# Patient Record
Sex: Female | Born: 1950
Health system: Southern US, Community
[De-identification: ages and names within clinical notes are randomized; demographics above are authoritative.]

## PROBLEM LIST (undated history)

## (undated) DIAGNOSIS — K5792 Diverticulitis of intestine, part unspecified, without perforation or abscess without bleeding: Secondary | ICD-10-CM

## (undated) DIAGNOSIS — M8580 Other specified disorders of bone density and structure, unspecified site: Secondary | ICD-10-CM

## (undated) DIAGNOSIS — IMO0002 Reserved for concepts with insufficient information to code with codable children: Secondary | ICD-10-CM

## (undated) DIAGNOSIS — A609 Anogenital herpesviral infection, unspecified: Secondary | ICD-10-CM

## (undated) DIAGNOSIS — M810 Age-related osteoporosis without current pathological fracture: Secondary | ICD-10-CM

## (undated) DIAGNOSIS — K589 Irritable bowel syndrome without diarrhea: Secondary | ICD-10-CM

## (undated) DIAGNOSIS — K5900 Constipation, unspecified: Secondary | ICD-10-CM

## (undated) HISTORY — DX: Other specified disorders of bone density and structure, unspecified site: M85.80

## (undated) HISTORY — DX: Irritable bowel syndrome, unspecified: K58.9

## (undated) HISTORY — DX: Anogenital herpesviral infection, unspecified: A60.9

## (undated) HISTORY — DX: Diverticulitis of intestine, part unspecified, without perforation or abscess without bleeding: K57.92

## (undated) HISTORY — PX: OTHER SURGICAL HISTORY: SHX169

## (undated) HISTORY — DX: Reserved for concepts with insufficient information to code with codable children: IMO0002

## (undated) HISTORY — DX: Constipation, unspecified: K59.00

## (undated) HISTORY — PX: APPENDECTOMY: SHX54

## (undated) SURGERY — Surgical Case
Anesthesia: *Unknown

---
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
## 1997-12-25 ENCOUNTER — Ambulatory Visit (HOSPITAL_COMMUNITY): Admission: RE | Admit: 1997-12-25 | Discharge: 1997-12-25 | Payer: Self-pay | Admitting: Internal Medicine

## 1998-02-05 ENCOUNTER — Other Ambulatory Visit: Admission: RE | Admit: 1998-02-05 | Discharge: 1998-02-05 | Payer: Self-pay | Admitting: Gynecology

## 1998-05-05 ENCOUNTER — Other Ambulatory Visit: Admission: RE | Admit: 1998-05-05 | Discharge: 1998-05-05 | Payer: Self-pay | Admitting: Gynecology

## 1998-06-05 DIAGNOSIS — IMO0002 Reserved for concepts with insufficient information to code with codable children: Secondary | ICD-10-CM

## 1998-06-05 HISTORY — DX: Reserved for concepts with insufficient information to code with codable children: IMO0002

## 1998-11-15 ENCOUNTER — Other Ambulatory Visit: Admission: RE | Admit: 1998-11-15 | Discharge: 1998-11-15 | Payer: Self-pay | Admitting: Gynecology

## 1998-12-31 ENCOUNTER — Other Ambulatory Visit: Admission: RE | Admit: 1998-12-31 | Discharge: 1998-12-31 | Payer: Self-pay | Admitting: Gynecology

## 1999-08-09 ENCOUNTER — Other Ambulatory Visit: Admission: RE | Admit: 1999-08-09 | Discharge: 1999-08-09 | Payer: Self-pay | Admitting: Gynecology

## 2000-02-08 ENCOUNTER — Other Ambulatory Visit: Admission: RE | Admit: 2000-02-08 | Discharge: 2000-02-08 | Payer: Self-pay | Admitting: Gynecology

## 2000-12-19 ENCOUNTER — Other Ambulatory Visit: Admission: RE | Admit: 2000-12-19 | Discharge: 2000-12-19 | Payer: Self-pay | Admitting: Gynecology

## 2001-02-01 ENCOUNTER — Emergency Department (HOSPITAL_COMMUNITY): Admission: EM | Admit: 2001-02-01 | Discharge: 2001-02-01 | Payer: Self-pay | Admitting: Emergency Medicine

## 2001-12-25 ENCOUNTER — Other Ambulatory Visit: Admission: RE | Admit: 2001-12-25 | Discharge: 2001-12-25 | Payer: Self-pay | Admitting: Gynecology

## 2003-02-26 ENCOUNTER — Other Ambulatory Visit: Admission: RE | Admit: 2003-02-26 | Discharge: 2003-02-26 | Payer: Self-pay | Admitting: Gynecology

## 2010-06-05 HISTORY — PX: COLON SURGERY: SHX602

## 2014-07-16 ENCOUNTER — Ambulatory Visit: Payer: Self-pay | Admitting: Gynecology

## 2014-07-17 ENCOUNTER — Encounter: Payer: Self-pay | Admitting: Gynecology

## 2014-07-17 ENCOUNTER — Other Ambulatory Visit (HOSPITAL_COMMUNITY)
Admission: RE | Admit: 2014-07-17 | Discharge: 2014-07-17 | Disposition: A | Discharge status: Home / Self Care | Payer: BLUE CROSS/BLUE SHIELD | Source: Ambulatory Visit | Attending: Gynecology | Admitting: Gynecology

## 2014-07-17 ENCOUNTER — Ambulatory Visit (INDEPENDENT_AMBULATORY_CARE_PROVIDER_SITE_OTHER): Payer: BLUE CROSS/BLUE SHIELD | Admitting: Gynecology

## 2014-07-17 VITALS — BP 118/80 | Ht 64.0 in | Wt 169.0 lb

## 2014-07-17 DIAGNOSIS — N952 Postmenopausal atrophic vaginitis: Secondary | ICD-10-CM

## 2014-07-17 DIAGNOSIS — M8580 Other specified disorders of bone density and structure, unspecified site: Secondary | ICD-10-CM

## 2014-07-17 DIAGNOSIS — Z01419 Encounter for gynecological examination (general) (routine) without abnormal findings: Secondary | ICD-10-CM | POA: Diagnosis not present

## 2014-07-17 DIAGNOSIS — Z1151 Encounter for screening for human papillomavirus (HPV): Secondary | ICD-10-CM | POA: Insufficient documentation

## 2014-07-17 LAB — URINALYSIS W MICROSCOPIC + REFLEX CULTURE
Bacteria, UA: NONE SEEN
Bilirubin Urine: NEGATIVE
Casts: NONE SEEN
Glucose, UA: NEGATIVE mg/dL
Hgb urine dipstick: NEGATIVE
Ketones, ur: NEGATIVE mg/dL
Nitrite: NEGATIVE
Protein, ur: NEGATIVE mg/dL
Specific Gravity, Urine: 1.025 (ref 1.005–1.030)
Urobilinogen, UA: 1 mg/dL (ref 0.0–1.0)
pH: 6 (ref 5.0–8.0)

## 2014-07-17 MED ORDER — VALACYCLOVIR HCL 500 MG PO TABS: 500.0000 mg | ORAL_TABLET | Freq: Two times a day (BID) | ORAL | Status: DC

## 2014-07-17 NOTE — Progress Notes (Signed)
Tracy [name] 01-09-1951 [id number]        64 y.o.  [id number] for annual exam.  Has recently moved back into the area and has not been seen in this office for a number of years. Several issues noted below.  Past medical history,surgical history, problem list, medications, allergies, family history and social history were all reviewed and documented as reviewed in the EPIC chart.  ROS:  Performed with pertinent positives and negatives included in the history, assessment and plan.   Additional significant findings :  none   Exam: Tracy [name] Vitals:   07/17/14 0829  BP: 118/80  Height: 5\' 4"  (1.626 m)  Weight: 169 lb (76.658 kg)   General appearance:  Normal affect, orientation and appearance. Skin: Grossly normal HEENT: Without gross lesions.  No cervical or supraclavicular adenopathy. Thyroid normal.  Lungs:  Clear without wheezing, rales or rhonchi Cardiac: RR, without RMG Abdominal:  Soft, nontender, without masses, guarding, rebound, organomegaly or hernia Breasts:  Examined lying and sitting without masses, retractions, discharge or axillary adenopathy. Pelvic:  Ext/BUS/vagina with mild atrophic changes. Mild pelvic relaxation noted but no significant cystocele or rectocele or uterine prolapse.  Cervix with atrophic changes. Pap smear/HPV  Uterus axial to retroverted, normal size, shape and contour, midline and mobile nontender   Adnexa  Without masses or tenderness    Anus and perineum  Normal   Rectovaginal  Normal sphincter tone without palpated masses or tenderness.    Assessment/Plan:  64 y.o. [id number] female for annual exam.   1. Postmenopausal/atrophic genital changes. Patient does note some intermittent vaginal dryness. No significant hot flushes or night sweats or any vaginal bleeding. Has not tried any OTC products. Recommended trial of OTC moisturizers such as Replens. Reviewed options to include vaginal estrogen support. Patient not interested at this  time. Will represent if continues to be an issue and wants to rediscuss options. 2. Pelvic pressure. Patient notes some pelvic pressure and wonders if things "are falling out". Exam shows some relaxation but no significant cystocele rectocele or uterine prolapse. Reviewed with patient and she is comfortable with following at this point. Check urinalysis. Will follow up if symptoms worsen and she wants to rediscuss options. 3. Osteopenia. DEXA 2004 with osteopenia T score -2.3. Transient trial of Fosamax which was discontinued due to GI upset. Has not done anything further for treatment. Repeat ports last bone density 2014 in [place]. Will obtain copy for me. Schedule follow up DEXA now a 2 year interval. Increased calcium and vitamin D reviewed. 4. History of genital HSV. Occasional outbreaks. Uses Valtrex 500 mg twice a day 5 days. Prescription provided with 5 refills. 5. Mammography 2014. Patient knows she is overdue and agrees to schedule. Names and numbers provided. SBE monthly reviewed. 6. Pap smear reported 2014. History of transient ascus 2000 with negative colposcopy and ECC. Subsequent Pap smears have all been normal. Pap smear/HPV done today as baseline. 7. Colonoscopy reported 2008 with recommended repeat interval 10 years. 8. Health maintenance. Patient reports recent exam with Dr. Laurann Montana to include lab work. No blood work done today. Follow up for bone density otherwise annually, sooner if any issues.     Anastasio Auerbach MD, 9:03 AM 07/17/2014

## 2014-07-17 NOTE — Addendum Note (Signed)
Addended by: Burnett Kanaris on: 07/17/2014 09:31 AM   Modules accepted: Orders, SmartSet

## 2014-07-17 NOTE — Patient Instructions (Signed)
Call to Schedule your mammogram  Facilities in [place]: 1)  [facility], [address] Phone: [phone] 2)  [facility]. [address] [place] Phone: [phone] 3)  Dr. Isaiah Blakes at [facility] [address] Phone: [phone]     Mammogram A mammogram is an X-ray test to find changes in a woman's breast. You should get a mammogram if:  You are 65 years of age or older  You have risk factors.   Your doctor recommends that you have one.  BEFORE THE TEST  Do not schedule the test the week before your period, especially if your breasts are sore during this time.  On the day of your mammogram:  Wash your breasts and armpits well. After washing, do not put on any deodorant or talcum powder on until after your test.   Eat and drink as you usually do.   Take your medicines as usual.   If you are diabetic and take insulin, make sure you:   Eat before coming for your test.   Take your insulin as usual.   If you cannot keep your appointment, call before the appointment to cancel. Schedule another appointment.  TEST  You will need to undress from the waist up. You will put on a hospital gown.   Your breast will be put on the mammogram machine, and it will press firmly on your breast with a piece of plastic called a compression paddle. This will make your breast flatter so that the machine can X-ray all parts of your breast.   Both breasts will be X-rayed. Each breast will be X-rayed from above and from the side. An X-ray might need to be taken again if the picture is not good enough.   The mammogram will last about 15 to 30 minutes.  AFTER THE TEST Finding out the results of your test Ask when your test results will be ready. Make sure you get your test results.  Document Released: 08/18/2008 Document Revised: 05/11/2011 Document Reviewed: 08/18/2008 [facility] Patient  Information 2012 [place].  You may obtain a copy of any labs that were done today by logging onto MyChart as outlined in the instructions provided with your AVS (after visit summary). The office will not call with normal lab results but certainly if there are any significant abnormalities then we will contact you.   Health Maintenance, Female A healthy lifestyle and preventative care can promote health and wellness.  Maintain regular health, dental, and eye exams.  Eat a healthy diet. Foods like vegetables, fruits, whole grains, low-fat dairy products, and lean protein foods contain the nutrients you need without too many calories. Decrease your intake of foods high in solid fats, added sugars, and salt. Get information about a proper diet from your caregiver, if necessary.  Regular physical exercise is one of the most important things you can do for your health. Most adults should get at least 150 minutes of moderate-intensity exercise (any activity that increases your heart rate and causes you to sweat) each week. In addition, most adults need muscle-strengthening exercises on 2 or more days a week.   Maintain a healthy weight. The body mass index (BMI) is a screening tool to identify possible weight problems. It provides an estimate of body fat based on height and weight. Your caregiver can help determine your BMI, and can help you achieve or maintain a healthy weight. For adults  20 years and older:  A BMI below 18.5 is considered underweight.  A BMI of 18.5 to 24.9 is normal.  A BMI of 25 to 29.9 is considered overweight.  A BMI of 30 and above is considered obese.  Maintain normal blood lipids and cholesterol by exercising and minimizing your intake of saturated fat. Eat a balanced diet with plenty of fruits and vegetables. Blood tests for lipids and cholesterol should begin at age 20 and be repeated every 5 years. If your lipid or cholesterol levels are high, you are over 50, or  you are a high risk for heart disease, you may need your cholesterol levels checked more frequently.Ongoing high lipid and cholesterol levels should be treated with medicines if diet and exercise are not effective.  If you smoke, find out from your caregiver how to quit. If you do not use tobacco, do not start.  Lung cancer screening is recommended for adults aged 55 80 years who are at high risk for developing lung cancer because of a history of smoking. Yearly low-dose computed tomography (CT) is recommended for people who have at least a 30-pack-year history of smoking and are a current smoker or have quit within the past 15 years. A pack year of smoking is smoking an average of 1 pack of cigarettes a day for 1 year (for example: 1 pack a day for 30 years or 2 packs a day for 15 years). Yearly screening should continue until the smoker has stopped smoking for at least 15 years. Yearly screening should also be stopped for people who develop a health problem that would prevent them from having lung cancer treatment.  If you are pregnant, do not drink alcohol. If you are breastfeeding, be very cautious about drinking alcohol. If you are not pregnant and choose to drink alcohol, do not exceed 1 drink per day. One drink is considered to be 12 ounces (355 mL) of beer, 5 ounces (148 mL) of wine, or 1.5 ounces (44 mL) of liquor.  Avoid use of street drugs. Do not share needles with anyone. Ask for help if you need support or instructions about stopping the use of drugs.  High blood pressure causes heart disease and increases the risk of stroke. Blood pressure should be checked at least every 1 to 2 years. Ongoing high blood pressure should be treated with medicines, if weight loss and exercise are not effective.  If you are 55 to 65 years old, ask your caregiver if you should take aspirin to prevent strokes.  Diabetes screening involves taking a blood sample to check your fasting blood sugar level. This  should be done once every 3 years, after age 45, if you are within normal weight and without risk factors for diabetes. Testing should be considered at a younger age or be carried out more frequently if you are overweight and have at least 1 risk factor for diabetes.  Breast cancer screening is essential preventative care for women. You should practice "breast self-awareness." This means understanding the normal appearance and feel of your breasts and may include breast self-examination. Any changes detected, no matter how small, should be reported to a caregiver. Women in their 20s and 30s should have a clinical breast exam (CBE) by a caregiver as part of a regular health exam every 1 to 3 years. After age 40, women should have a CBE every year. Starting at age 40, women should consider having a mammogram (breast X-ray) every year. Women who have a   family history of breast cancer should talk to their caregiver about genetic screening. Women at a high risk of breast cancer should talk to their caregiver about having an MRI and a mammogram every year.  Breast cancer gene (BRCA)-related cancer risk assessment is recommended for women who have family members with BRCA-related cancers. BRCA-related cancers include breast, ovarian, tubal, and peritoneal cancers. Having family members with these cancers may be associated with an increased risk for harmful changes (mutations) in the breast cancer genes BRCA1 and BRCA2. Results of the assessment will determine the need for genetic counseling and BRCA1 and BRCA2 testing.  The Pap test is a screening test for cervical cancer. Women should have a Pap test starting at age 33. Between ages 75 and 14, Pap tests should be repeated every 2 years. Beginning at age 4, you should have a Pap test every 3 years as long as the past 3 Pap tests have been normal. If you had a hysterectomy for a problem that was not cancer or a condition that could lead to cancer, then you no longer  need Pap tests. If you are between ages 72 and 12, and you have had normal Pap tests going back 10 years, you no longer need Pap tests. If you have had past treatment for cervical cancer or a condition that could lead to cancer, you need Pap tests and screening for cancer for at least 20 years after your treatment. If Pap tests have been discontinued, risk factors (such as a new sexual partner) need to be reassessed to determine if screening should be resumed. Some women have medical problems that increase the chance of getting cervical cancer. In these cases, your caregiver may recommend more frequent screening and Pap tests.  The human papillomavirus (HPV) test is an additional test that may be used for cervical cancer screening. The HPV test looks for the virus that can cause the cell changes on the cervix. The cells collected during the Pap test can be tested for HPV. The HPV test could be used to screen women aged 84 years and older, and should be used in women of any age who have unclear Pap test results. After the age of 73, women should have HPV testing at the same frequency as a Pap test.  Colorectal cancer can be detected and often prevented. Most routine colorectal cancer screening begins at the age of 56 and continues through age 76. However, your caregiver may recommend screening at an earlier age if you have risk factors for colon cancer. On a yearly basis, your caregiver may provide home test kits to check for hidden blood in the stool. Use of a small camera at the end of a tube, to directly examine the colon (sigmoidoscopy or colonoscopy), can detect the earliest forms of colorectal cancer. Talk to your caregiver about this at age 20, when routine screening begins. Direct examination of the colon should be repeated every 5 to 10 years through age 83, unless early forms of pre-cancerous polyps or small growths are found.  Hepatitis C blood testing is recommended for all people born from [birth year]  through 1965 and any individual with known risks for hepatitis C.  Practice safe sex. Use condoms and avoid high-risk sexual practices to reduce the spread of sexually transmitted infections (STIs). Sexually active women aged 28 and younger should be checked for Chlamydia, which is a common sexually transmitted infection. Older women with new or multiple partners should also be tested for Chlamydia. Testing for  other STIs is recommended if you are sexually active and at increased risk.  Osteoporosis is a disease in which the bones lose minerals and strength with aging. This can result in serious bone fractures. The risk of osteoporosis can be identified using a bone density scan. Women ages 35 and over and women at risk for fractures or osteoporosis should discuss screening with their caregivers. Ask your caregiver whether you should be taking a calcium supplement or vitamin D to reduce the rate of osteoporosis.  Menopause can be associated with physical symptoms and risks. Hormone replacement therapy is available to decrease symptoms and risks. You should talk to your caregiver about whether hormone replacement therapy is right for you.  Use sunscreen. Apply sunscreen liberally and repeatedly throughout the day. You should seek shade when your shadow is shorter than you. Protect yourself by wearing long sleeves, pants, a wide-brimmed hat, and sunglasses year round, whenever you are outdoors.  Notify your caregiver of new moles or changes in moles, especially if there is a change in shape or color. Also notify your caregiver if a mole is larger than the size of a pencil eraser.  Stay current with your immunizations. Document Released: 12/05/2010 Document Revised: 09/16/2012 Document Reviewed: 12/05/2010 [facility] Patient Information 2014 [place].

## 2014-07-19 LAB — URINE CULTURE
Colony Count: NO GROWTH
Organism ID, Bacteria: NO GROWTH

## 2014-07-20 LAB — CYTOLOGY - PAP

## 2014-07-24 ENCOUNTER — Encounter: Payer: Self-pay | Admitting: Gynecology

## 2014-07-27 ENCOUNTER — Ambulatory Visit (INDEPENDENT_AMBULATORY_CARE_PROVIDER_SITE_OTHER): Payer: BLUE CROSS/BLUE SHIELD

## 2014-07-27 ENCOUNTER — Other Ambulatory Visit: Payer: Self-pay | Admitting: Gynecology

## 2014-07-27 DIAGNOSIS — M8580 Other specified disorders of bone density and structure, unspecified site: Secondary | ICD-10-CM

## 2014-07-27 DIAGNOSIS — Z13820 Encounter for screening for osteoporosis: Secondary | ICD-10-CM

## 2014-07-27 DIAGNOSIS — Z780 Asymptomatic menopausal state: Secondary | ICD-10-CM | POA: Diagnosis not present

## 2014-07-28 ENCOUNTER — Encounter: Payer: Self-pay | Admitting: Gynecology

## 2014-08-06 ENCOUNTER — Other Ambulatory Visit: Payer: Self-pay | Admitting: Gynecology

## 2014-08-06 DIAGNOSIS — Z780 Asymptomatic menopausal state: Secondary | ICD-10-CM

## 2014-08-06 DIAGNOSIS — Z13820 Encounter for screening for osteoporosis: Secondary | ICD-10-CM

## 2014-08-06 DIAGNOSIS — M8580 Other specified disorders of bone density and structure, unspecified site: Secondary | ICD-10-CM

## 2014-08-13 ENCOUNTER — Other Ambulatory Visit: Payer: Self-pay | Admitting: Gynecology

## 2014-08-13 DIAGNOSIS — M8580 Other specified disorders of bone density and structure, unspecified site: Secondary | ICD-10-CM

## 2014-08-20 ENCOUNTER — Encounter: Payer: Self-pay | Admitting: Gynecology

## 2015-03-15 DIAGNOSIS — H04123 Dry eye syndrome of bilateral lacrimal glands: Secondary | ICD-10-CM | POA: Diagnosis not present

## 2015-03-15 DIAGNOSIS — H2513 Age-related nuclear cataract, bilateral: Secondary | ICD-10-CM | POA: Diagnosis not present

## 2015-05-04 DIAGNOSIS — L814 Other melanin hyperpigmentation: Secondary | ICD-10-CM | POA: Diagnosis not present

## 2015-05-04 DIAGNOSIS — D2271 Melanocytic nevi of right lower limb, including hip: Secondary | ICD-10-CM | POA: Diagnosis not present

## 2015-05-04 DIAGNOSIS — I788 Other diseases of capillaries: Secondary | ICD-10-CM | POA: Diagnosis not present

## 2015-05-04 DIAGNOSIS — L738 Other specified follicular disorders: Secondary | ICD-10-CM | POA: Diagnosis not present

## 2015-05-04 DIAGNOSIS — D2261 Melanocytic nevi of right upper limb, including shoulder: Secondary | ICD-10-CM | POA: Diagnosis not present

## 2015-05-04 DIAGNOSIS — L821 Other seborrheic keratosis: Secondary | ICD-10-CM | POA: Diagnosis not present

## 2015-05-04 DIAGNOSIS — L819 Disorder of pigmentation, unspecified: Secondary | ICD-10-CM | POA: Diagnosis not present

## 2015-05-04 DIAGNOSIS — L820 Inflamed seborrheic keratosis: Secondary | ICD-10-CM | POA: Diagnosis not present

## 2015-05-04 DIAGNOSIS — D1801 Hemangioma of skin and subcutaneous tissue: Secondary | ICD-10-CM | POA: Diagnosis not present

## 2015-05-04 DIAGNOSIS — D2272 Melanocytic nevi of left lower limb, including hip: Secondary | ICD-10-CM | POA: Diagnosis not present

## 2015-06-18 ENCOUNTER — Other Ambulatory Visit: Payer: Self-pay | Admitting: Internal Medicine

## 2015-06-18 ENCOUNTER — Ambulatory Visit
Admission: RE | Admit: 2015-06-18 | Discharge: 2015-06-18 | Disposition: A | Discharge status: Home / Self Care | Payer: BLUE CROSS/BLUE SHIELD | Source: Ambulatory Visit | Attending: Internal Medicine | Admitting: Internal Medicine

## 2015-06-18 DIAGNOSIS — Z0000 Encounter for general adult medical examination without abnormal findings: Secondary | ICD-10-CM | POA: Diagnosis not present

## 2015-06-18 DIAGNOSIS — Z23 Encounter for immunization: Secondary | ICD-10-CM | POA: Diagnosis not present

## 2015-06-18 DIAGNOSIS — M2550 Pain in unspecified joint: Secondary | ICD-10-CM | POA: Diagnosis not present

## 2015-06-18 DIAGNOSIS — M25562 Pain in left knee: Secondary | ICD-10-CM | POA: Diagnosis not present

## 2015-08-02 ENCOUNTER — Ambulatory Visit (INDEPENDENT_AMBULATORY_CARE_PROVIDER_SITE_OTHER): Payer: BLUE CROSS/BLUE SHIELD | Admitting: Gynecology

## 2015-08-02 ENCOUNTER — Encounter: Payer: Self-pay | Admitting: Gynecology

## 2015-08-02 VITALS — BP 130/80 | Ht 63.0 in | Wt 171.0 lb

## 2015-08-02 DIAGNOSIS — Z01419 Encounter for gynecological examination (general) (routine) without abnormal findings: Secondary | ICD-10-CM | POA: Diagnosis not present

## 2015-08-02 DIAGNOSIS — M8580 Other specified disorders of bone density and structure, unspecified site: Secondary | ICD-10-CM | POA: Diagnosis not present

## 2015-08-02 DIAGNOSIS — A609 Anogenital herpesviral infection, unspecified: Secondary | ICD-10-CM

## 2015-08-02 DIAGNOSIS — N952 Postmenopausal atrophic vaginitis: Secondary | ICD-10-CM | POA: Diagnosis not present

## 2015-08-02 MED ORDER — VALACYCLOVIR HCL 500 MG PO TABS: 500.0000 mg | ORAL_TABLET | Freq: Two times a day (BID) | ORAL | Status: DC

## 2015-08-02 NOTE — Patient Instructions (Signed)

## 2015-08-02 NOTE — Progress Notes (Signed)
Tracy [name] July 09, 1950 [id number]        64 y.o.  [id number]  for breast and pelvic exam.  Several issues noted below  Past medical history,surgical history, problem list, medications, allergies, family history and social history were all reviewed and documented as reviewed in the EPIC chart.  ROS:  Performed with pertinent positives and negatives included in the history, assessment and plan.   Additional significant findings :  none   Exam: Tracy [name] assistant Filed Vitals:   08/02/15 1055  BP: 130/80  Height: 5\' 3"  (1.6 m)  Weight: 171 lb (77.565 kg)   General appearance:  Normal affect, orientation and appearance. Skin: Grossly normal HEENT: Without gross lesions.  No cervical or supraclavicular adenopathy. Thyroid normal.  Lungs:  Clear without wheezing, rales or rhonchi Cardiac: RR, without RMG Abdominal:  Soft, nontender, without masses, guarding, rebound, organomegaly or hernia Breasts:  Examined lying and sitting without masses, retractions, discharge or axillary adenopathy. Pelvic:  Ext/BUS/vagina with atrophic changes. Mild relaxation noted with mild cystocele and rectocele  Cervix atrophic  Uterus anteverted, normal size, shape and contour, midline and mobile nontender   Adnexa without masses or tenderness    Anus and perineum normal   Rectovaginal normal sphincter tone without palpated masses or tenderness.    Assessment/Plan:  65 y.o. [id number] female for breast and pelvic exam.   1. Postmenopausal/atrophic genital changes. Without significant hot flushes, night sweats, vaginal dryness or any vaginal bleeding. Continue to monitor and report any issues or vaginal bleeding. 2. Mild pelvic relaxation. Asymptomatic to the patient. Obtain a follow with annual exams. 3. Osteopenia.  DEXA 07/2014 T score -2.2 FRAX 7.9%/0.9%. Repeat DEXA next year at two-year interval. Increased calcium and vitamin D. 4. Pap smear/HPV 07/2014 negative. No history of abnormal Pap smears.  Options to stop screening altogether versus less for screening intervals as she is now 14 discussed. Will readdress on annual basis. 5. Mammography coming due in March/April. I reminded her to schedule this. SBE monthly reviewed. 6. Colonoscopy 2008. Patient reports 10 year recommendation. 7. History of HSV. Uses Valtrex intermittently. Refill provided. 8. Health maintenance. No routine blood work done as patient reports this done at her primary physician's office. Follow up in one year, sooner as needed.   Anastasio Auerbach MD, 11:17 AM 08/02/2015

## 2015-08-03 LAB — URINALYSIS W MICROSCOPIC + REFLEX CULTURE
Bacteria, UA: NONE SEEN [HPF]
Bilirubin Urine: NEGATIVE
Casts: NONE SEEN [LPF]
Crystals: NONE SEEN [HPF]
Glucose, UA: NEGATIVE
Hgb urine dipstick: NEGATIVE
Ketones, ur: NEGATIVE
Leukocytes, UA: NEGATIVE
Nitrite: NEGATIVE
Protein, ur: NEGATIVE
Specific Gravity, Urine: 1.019 (ref 1.001–1.035)
Yeast: NONE SEEN [HPF]
pH: 7 (ref 5.0–8.0)

## 2015-08-05 ENCOUNTER — Other Ambulatory Visit: Payer: Self-pay | Admitting: Gynecology

## 2015-08-05 MED ORDER — SULFAMETHOXAZOLE-TRIMETHOPRIM 800-160 MG PO TABS: 1.0000 | ORAL_TABLET | Freq: Two times a day (BID) | ORAL | Status: DC

## 2015-08-07 LAB — URINE CULTURE: Colony Count: >=100000

## 2015-08-12 DIAGNOSIS — E669 Obesity, unspecified: Secondary | ICD-10-CM | POA: Diagnosis not present

## 2015-08-12 DIAGNOSIS — R1032 Left lower quadrant pain: Secondary | ICD-10-CM | POA: Diagnosis not present

## 2015-08-12 DIAGNOSIS — K5900 Constipation, unspecified: Secondary | ICD-10-CM | POA: Diagnosis not present

## 2015-08-12 DIAGNOSIS — K5730 Diverticulosis of large intestine without perforation or abscess without bleeding: Secondary | ICD-10-CM | POA: Diagnosis not present

## 2015-09-13 DIAGNOSIS — Z803 Family history of malignant neoplasm of breast: Secondary | ICD-10-CM | POA: Diagnosis not present

## 2015-09-13 DIAGNOSIS — Z1231 Encounter for screening mammogram for malignant neoplasm of breast: Secondary | ICD-10-CM | POA: Diagnosis not present

## 2015-09-24 ENCOUNTER — Encounter: Payer: Self-pay | Admitting: Gynecology

## 2015-10-11 DIAGNOSIS — M25561 Pain in right knee: Secondary | ICD-10-CM | POA: Diagnosis not present

## 2015-10-11 DIAGNOSIS — M546 Pain in thoracic spine: Secondary | ICD-10-CM | POA: Diagnosis not present

## 2015-10-11 DIAGNOSIS — M791 Myalgia: Secondary | ICD-10-CM | POA: Diagnosis not present

## 2015-10-11 DIAGNOSIS — R5381 Other malaise: Secondary | ICD-10-CM | POA: Diagnosis not present

## 2015-11-08 DIAGNOSIS — M166 Other bilateral secondary osteoarthritis of hip: Secondary | ICD-10-CM | POA: Diagnosis not present

## 2015-11-08 DIAGNOSIS — M546 Pain in thoracic spine: Secondary | ICD-10-CM | POA: Diagnosis not present

## 2015-11-08 DIAGNOSIS — M174 Other bilateral secondary osteoarthritis of knee: Secondary | ICD-10-CM | POA: Diagnosis not present

## 2015-11-08 DIAGNOSIS — M5137 Other intervertebral disc degeneration, lumbosacral region: Secondary | ICD-10-CM | POA: Diagnosis not present

## 2016-02-21 DIAGNOSIS — M5137 Other intervertebral disc degeneration, lumbosacral region: Secondary | ICD-10-CM | POA: Diagnosis not present

## 2016-02-21 DIAGNOSIS — M79641 Pain in right hand: Secondary | ICD-10-CM | POA: Diagnosis not present

## 2016-02-21 DIAGNOSIS — M5030 Other cervical disc degeneration, unspecified cervical region: Secondary | ICD-10-CM | POA: Diagnosis not present

## 2016-02-21 DIAGNOSIS — M546 Pain in thoracic spine: Secondary | ICD-10-CM | POA: Diagnosis not present

## 2016-02-22 ENCOUNTER — Encounter (INDEPENDENT_AMBULATORY_CARE_PROVIDER_SITE_OTHER): Payer: Self-pay

## 2016-06-19 DIAGNOSIS — M47814 Spondylosis without myelopathy or radiculopathy, thoracic region: Secondary | ICD-10-CM | POA: Diagnosis not present

## 2016-06-19 DIAGNOSIS — M5134 Other intervertebral disc degeneration, thoracic region: Secondary | ICD-10-CM | POA: Diagnosis not present

## 2016-06-19 DIAGNOSIS — M546 Pain in thoracic spine: Secondary | ICD-10-CM | POA: Diagnosis not present

## 2016-06-19 DIAGNOSIS — M9902 Segmental and somatic dysfunction of thoracic region: Secondary | ICD-10-CM | POA: Diagnosis not present

## 2016-06-20 DIAGNOSIS — R030 Elevated blood-pressure reading, without diagnosis of hypertension: Secondary | ICD-10-CM | POA: Diagnosis not present

## 2016-06-20 DIAGNOSIS — Z1389 Encounter for screening for other disorder: Secondary | ICD-10-CM | POA: Diagnosis not present

## 2016-06-20 DIAGNOSIS — Z0000 Encounter for general adult medical examination without abnormal findings: Secondary | ICD-10-CM | POA: Diagnosis not present

## 2016-07-17 DIAGNOSIS — M9902 Segmental and somatic dysfunction of thoracic region: Secondary | ICD-10-CM | POA: Diagnosis not present

## 2016-07-17 DIAGNOSIS — M546 Pain in thoracic spine: Secondary | ICD-10-CM | POA: Diagnosis not present

## 2016-07-17 DIAGNOSIS — M47814 Spondylosis without myelopathy or radiculopathy, thoracic region: Secondary | ICD-10-CM | POA: Diagnosis not present

## 2016-07-17 DIAGNOSIS — M5134 Other intervertebral disc degeneration, thoracic region: Secondary | ICD-10-CM | POA: Diagnosis not present

## 2016-07-31 DIAGNOSIS — D2372 Other benign neoplasm of skin of left lower limb, including hip: Secondary | ICD-10-CM | POA: Diagnosis not present

## 2016-07-31 DIAGNOSIS — D2272 Melanocytic nevi of left lower limb, including hip: Secondary | ICD-10-CM | POA: Diagnosis not present

## 2016-07-31 DIAGNOSIS — L738 Other specified follicular disorders: Secondary | ICD-10-CM | POA: Diagnosis not present

## 2016-07-31 DIAGNOSIS — D2271 Melanocytic nevi of right lower limb, including hip: Secondary | ICD-10-CM | POA: Diagnosis not present

## 2016-07-31 DIAGNOSIS — L821 Other seborrheic keratosis: Secondary | ICD-10-CM | POA: Diagnosis not present

## 2016-07-31 DIAGNOSIS — L918 Other hypertrophic disorders of the skin: Secondary | ICD-10-CM | POA: Diagnosis not present

## 2016-07-31 DIAGNOSIS — D2261 Melanocytic nevi of right upper limb, including shoulder: Secondary | ICD-10-CM | POA: Diagnosis not present

## 2016-07-31 DIAGNOSIS — D1801 Hemangioma of skin and subcutaneous tissue: Secondary | ICD-10-CM | POA: Diagnosis not present

## 2016-08-02 ENCOUNTER — Encounter: Payer: BLUE CROSS/BLUE SHIELD | Admitting: Gynecology

## 2016-08-07 DIAGNOSIS — M47814 Spondylosis without myelopathy or radiculopathy, thoracic region: Secondary | ICD-10-CM | POA: Diagnosis not present

## 2016-08-07 DIAGNOSIS — M9902 Segmental and somatic dysfunction of thoracic region: Secondary | ICD-10-CM | POA: Diagnosis not present

## 2016-08-07 DIAGNOSIS — M5134 Other intervertebral disc degeneration, thoracic region: Secondary | ICD-10-CM | POA: Diagnosis not present

## 2016-08-07 DIAGNOSIS — M546 Pain in thoracic spine: Secondary | ICD-10-CM | POA: Diagnosis not present

## 2016-08-09 ENCOUNTER — Encounter: Payer: Self-pay | Admitting: Gynecology

## 2016-08-09 ENCOUNTER — Ambulatory Visit (INDEPENDENT_AMBULATORY_CARE_PROVIDER_SITE_OTHER): Payer: Medicare Other | Admitting: Gynecology

## 2016-08-09 VITALS — BP 124/80 | Ht 63.0 in | Wt 168.0 lb

## 2016-08-09 DIAGNOSIS — M899 Disorder of bone, unspecified: Secondary | ICD-10-CM

## 2016-08-09 DIAGNOSIS — N952 Postmenopausal atrophic vaginitis: Secondary | ICD-10-CM

## 2016-08-09 DIAGNOSIS — Z01411 Encounter for gynecological examination (general) (routine) with abnormal findings: Secondary | ICD-10-CM | POA: Diagnosis not present

## 2016-08-09 NOTE — Patient Instructions (Signed)
Followup for bone density as scheduled. 

## 2016-08-09 NOTE — Progress Notes (Signed)
    Tracy [name] September 07, 1950 [id number]        66 y.o.  [id number] for annual exam.    Past medical history,surgical history, problem list, medications, allergies, family history and social history were all reviewed and documented as reviewed in the EPIC chart.  ROS:  Performed with pertinent positives and negatives included in the history, assessment and plan.   Additional significant findings :  None   Exam: [name] assistant Vitals:   08/09/16 1111  BP: 124/80  Weight: 168 lb (76.2 kg)  Height: 5\' 3"  (1.6 m)   Body mass index is 29.76 kg/m.  General appearance:  Normal affect, orientation and appearance. Skin: Grossly normal HEENT: Without gross lesions.  No cervical or supraclavicular adenopathy. Thyroid normal.  Lungs:  Clear without wheezing, rales or rhonchi Cardiac: RR, without RMG Abdominal:  Soft, nontender, without masses, guarding, rebound, organomegaly or hernia Breasts:  Examined lying and sitting without masses, retractions, discharge or axillary adenopathy. Pelvic:  Ext, BUS, Vagina: With atrophic changes. Minimal cystocele/rectocele  Cervix: With atrophic changes  Uterus: Anteverted, normal size, shape and contour, midline and mobile nontender   Adnexa: Without masses or tenderness    Anus and perineum: Normal   Rectovaginal: Normal sphincter tone without palpated masses or tenderness.    Assessment/Plan:  66 y.o. [id number] female for annual exam.   1. Postmenopausal/atrophic genital changes. No significant hot flushes, night sweats, vaginal dryness or bleeding. Continue monitor report any issues. 2. Mild pelvic relaxation asymptomatic to the patient. Continue to follow with annual exams. 3. Osteopenia. DEXA 07/2014 T score -2.2 FRAX 8%/0.9%. Repeat DEXA now and patient will schedule follow up for this. 4. Pap smear/HPV 07/2014. No Pap smear done today. No history of significant abnormal Pap smears. 5. Mammography coming do she has schedule. SBE monthly  reviewed. 6. Colonoscopy due now and patient will make arrangements for this. 7. Health maintenance. No routine lab work done as patient does this elsewhere. Follow up 1 year, sooner as needed.   Anastasio Auerbach MD, 11:59 AM 08/09/2016

## 2016-08-15 ENCOUNTER — Ambulatory Visit (INDEPENDENT_AMBULATORY_CARE_PROVIDER_SITE_OTHER): Payer: Medicare Other

## 2016-08-15 DIAGNOSIS — M8589 Other specified disorders of bone density and structure, multiple sites: Secondary | ICD-10-CM

## 2016-08-15 DIAGNOSIS — Z780 Asymptomatic menopausal state: Secondary | ICD-10-CM

## 2016-08-15 DIAGNOSIS — M899 Disorder of bone, unspecified: Secondary | ICD-10-CM

## 2016-08-16 ENCOUNTER — Other Ambulatory Visit: Payer: Self-pay | Admitting: Gynecology

## 2016-08-16 DIAGNOSIS — Z780 Asymptomatic menopausal state: Secondary | ICD-10-CM

## 2016-08-16 DIAGNOSIS — M8589 Other specified disorders of bone density and structure, multiple sites: Secondary | ICD-10-CM

## 2016-08-18 ENCOUNTER — Encounter: Payer: Self-pay | Admitting: Gynecology

## 2016-08-21 DIAGNOSIS — M546 Pain in thoracic spine: Secondary | ICD-10-CM | POA: Diagnosis not present

## 2016-08-21 DIAGNOSIS — M5134 Other intervertebral disc degeneration, thoracic region: Secondary | ICD-10-CM | POA: Diagnosis not present

## 2016-08-21 DIAGNOSIS — M9902 Segmental and somatic dysfunction of thoracic region: Secondary | ICD-10-CM | POA: Diagnosis not present

## 2016-08-21 DIAGNOSIS — M47814 Spondylosis without myelopathy or radiculopathy, thoracic region: Secondary | ICD-10-CM | POA: Diagnosis not present

## 2016-09-04 DIAGNOSIS — M5134 Other intervertebral disc degeneration, thoracic region: Secondary | ICD-10-CM | POA: Diagnosis not present

## 2016-09-04 DIAGNOSIS — M546 Pain in thoracic spine: Secondary | ICD-10-CM | POA: Diagnosis not present

## 2016-09-04 DIAGNOSIS — M9902 Segmental and somatic dysfunction of thoracic region: Secondary | ICD-10-CM | POA: Diagnosis not present

## 2016-09-04 DIAGNOSIS — M47814 Spondylosis without myelopathy or radiculopathy, thoracic region: Secondary | ICD-10-CM | POA: Diagnosis not present

## 2016-09-18 ENCOUNTER — Encounter: Payer: Self-pay | Admitting: Gynecology

## 2016-09-18 DIAGNOSIS — M546 Pain in thoracic spine: Secondary | ICD-10-CM | POA: Diagnosis not present

## 2016-09-18 DIAGNOSIS — M47814 Spondylosis without myelopathy or radiculopathy, thoracic region: Secondary | ICD-10-CM | POA: Diagnosis not present

## 2016-09-18 DIAGNOSIS — M9902 Segmental and somatic dysfunction of thoracic region: Secondary | ICD-10-CM | POA: Diagnosis not present

## 2016-09-18 DIAGNOSIS — M5134 Other intervertebral disc degeneration, thoracic region: Secondary | ICD-10-CM | POA: Diagnosis not present

## 2016-09-18 DIAGNOSIS — Z803 Family history of malignant neoplasm of breast: Secondary | ICD-10-CM | POA: Diagnosis not present

## 2016-09-18 DIAGNOSIS — Z1231 Encounter for screening mammogram for malignant neoplasm of breast: Secondary | ICD-10-CM | POA: Diagnosis not present

## 2016-10-02 DIAGNOSIS — M47814 Spondylosis without myelopathy or radiculopathy, thoracic region: Secondary | ICD-10-CM | POA: Diagnosis not present

## 2016-10-02 DIAGNOSIS — M9902 Segmental and somatic dysfunction of thoracic region: Secondary | ICD-10-CM | POA: Diagnosis not present

## 2016-10-02 DIAGNOSIS — M5134 Other intervertebral disc degeneration, thoracic region: Secondary | ICD-10-CM | POA: Diagnosis not present

## 2016-10-02 DIAGNOSIS — M546 Pain in thoracic spine: Secondary | ICD-10-CM | POA: Diagnosis not present

## 2016-10-13 ENCOUNTER — Telehealth: Payer: Self-pay | Admitting: Gynecology

## 2016-10-13 NOTE — Telephone Encounter (Signed)
Left message to call me back pertaining to medical release form.

## 2016-10-16 DIAGNOSIS — M47814 Spondylosis without myelopathy or radiculopathy, thoracic region: Secondary | ICD-10-CM | POA: Diagnosis not present

## 2016-10-16 DIAGNOSIS — M5134 Other intervertebral disc degeneration, thoracic region: Secondary | ICD-10-CM | POA: Diagnosis not present

## 2016-10-16 DIAGNOSIS — M9902 Segmental and somatic dysfunction of thoracic region: Secondary | ICD-10-CM | POA: Diagnosis not present

## 2016-10-16 DIAGNOSIS — M546 Pain in thoracic spine: Secondary | ICD-10-CM | POA: Diagnosis not present

## 2016-11-06 DIAGNOSIS — M546 Pain in thoracic spine: Secondary | ICD-10-CM | POA: Diagnosis not present

## 2016-11-06 DIAGNOSIS — M47814 Spondylosis without myelopathy or radiculopathy, thoracic region: Secondary | ICD-10-CM | POA: Diagnosis not present

## 2016-11-06 DIAGNOSIS — M5134 Other intervertebral disc degeneration, thoracic region: Secondary | ICD-10-CM | POA: Diagnosis not present

## 2016-11-06 DIAGNOSIS — M9902 Segmental and somatic dysfunction of thoracic region: Secondary | ICD-10-CM | POA: Diagnosis not present

## 2016-11-16 DIAGNOSIS — K5730 Diverticulosis of large intestine without perforation or abscess without bleeding: Secondary | ICD-10-CM | POA: Diagnosis not present

## 2016-11-16 DIAGNOSIS — K5904 Chronic idiopathic constipation: Secondary | ICD-10-CM | POA: Diagnosis not present

## 2016-11-16 DIAGNOSIS — R1032 Left lower quadrant pain: Secondary | ICD-10-CM | POA: Diagnosis not present

## 2016-11-27 DIAGNOSIS — M546 Pain in thoracic spine: Secondary | ICD-10-CM | POA: Diagnosis not present

## 2016-11-27 DIAGNOSIS — M9902 Segmental and somatic dysfunction of thoracic region: Secondary | ICD-10-CM | POA: Diagnosis not present

## 2016-11-27 DIAGNOSIS — M5134 Other intervertebral disc degeneration, thoracic region: Secondary | ICD-10-CM | POA: Diagnosis not present

## 2016-11-27 DIAGNOSIS — M47814 Spondylosis without myelopathy or radiculopathy, thoracic region: Secondary | ICD-10-CM | POA: Diagnosis not present

## 2016-12-05 ENCOUNTER — Other Ambulatory Visit: Payer: Self-pay | Admitting: Gynecology

## 2016-12-05 ENCOUNTER — Other Ambulatory Visit: Payer: Self-pay | Admitting: Gastroenterology

## 2016-12-05 DIAGNOSIS — R1011 Right upper quadrant pain: Secondary | ICD-10-CM

## 2016-12-05 DIAGNOSIS — K219 Gastro-esophageal reflux disease without esophagitis: Secondary | ICD-10-CM | POA: Diagnosis not present

## 2016-12-05 DIAGNOSIS — Z1211 Encounter for screening for malignant neoplasm of colon: Secondary | ICD-10-CM | POA: Diagnosis not present

## 2016-12-05 DIAGNOSIS — K5730 Diverticulosis of large intestine without perforation or abscess without bleeding: Secondary | ICD-10-CM | POA: Diagnosis not present

## 2016-12-05 DIAGNOSIS — R110 Nausea: Secondary | ICD-10-CM

## 2016-12-07 NOTE — Telephone Encounter (Signed)
Per Feb. 2017 annual note, "History of HSV. Uses Valtrex intermittently."

## 2016-12-25 ENCOUNTER — Ambulatory Visit (HOSPITAL_COMMUNITY)
Admission: RE | Admit: 2016-12-25 | Discharge: 2016-12-25 | Disposition: A | Discharge status: Home / Self Care | Payer: Medicare Other | Source: Ambulatory Visit | Attending: Gastroenterology | Admitting: Gastroenterology

## 2016-12-25 ENCOUNTER — Encounter (HOSPITAL_COMMUNITY)
Admission: RE | Admit: 2016-12-25 | Discharge: 2016-12-25 | Disposition: A | Discharge status: Home / Self Care | Payer: Medicare Other | Source: Ambulatory Visit | Attending: Gastroenterology | Admitting: Gastroenterology

## 2016-12-25 DIAGNOSIS — R1011 Right upper quadrant pain: Secondary | ICD-10-CM | POA: Diagnosis not present

## 2016-12-25 DIAGNOSIS — R110 Nausea: Secondary | ICD-10-CM

## 2016-12-25 DIAGNOSIS — N2889 Other specified disorders of kidney and ureter: Secondary | ICD-10-CM | POA: Insufficient documentation

## 2016-12-25 DIAGNOSIS — N1330 Unspecified hydronephrosis: Secondary | ICD-10-CM | POA: Diagnosis not present

## 2016-12-25 DIAGNOSIS — R109 Unspecified abdominal pain: Secondary | ICD-10-CM | POA: Diagnosis not present

## 2016-12-25 MED ORDER — TECHNETIUM TC 99M MEBROFENIN IV KIT
5.0000 | PACK | Freq: Once | INTRAVENOUS | Status: AC | PRN
  Administered 2016-12-25: 5 via INTRAVENOUS

## 2017-01-02 ENCOUNTER — Other Ambulatory Visit: Payer: Self-pay | Admitting: Internal Medicine

## 2017-01-02 DIAGNOSIS — N2889 Other specified disorders of kidney and ureter: Secondary | ICD-10-CM

## 2017-01-04 ENCOUNTER — Ambulatory Visit
Admission: RE | Admit: 2017-01-04 | Discharge: 2017-01-04 | Disposition: A | Discharge status: Home / Self Care | Payer: Medicare Other | Source: Ambulatory Visit | Attending: Internal Medicine | Admitting: Internal Medicine

## 2017-01-04 ENCOUNTER — Telehealth: Payer: Self-pay | Admitting: Gynecology

## 2017-01-04 ENCOUNTER — Encounter: Payer: Self-pay | Admitting: *Deleted

## 2017-01-04 DIAGNOSIS — K5730 Diverticulosis of large intestine without perforation or abscess without bleeding: Secondary | ICD-10-CM | POA: Diagnosis not present

## 2017-01-04 DIAGNOSIS — N2889 Other specified disorders of kidney and ureter: Secondary | ICD-10-CM

## 2017-01-04 NOTE — Telephone Encounter (Signed)
Pt scheduled for CT scan today at noon, I sent pt my chart message.

## 2017-01-04 NOTE — Telephone Encounter (Signed)
Pt received mychart message.

## 2017-01-04 NOTE — Telephone Encounter (Signed)
Tell patient I received a copy of ultrasound report that Dr. Collene Mares ordered. Overall look normal although they did make a note "Right renal pelviectasis. Consider further CT abdomen pelvis without contrast for evaluation of possible urinary tract obstruction."  The actual numbers did not look impressive but I just wanted to make sure that she touches base with Dr. Collene Mares in reference to this as she had ordered the test and really needs to follow up on it if any follow up needed.

## 2017-01-08 DIAGNOSIS — R030 Elevated blood-pressure reading, without diagnosis of hypertension: Secondary | ICD-10-CM | POA: Diagnosis not present

## 2017-01-08 DIAGNOSIS — M5134 Other intervertebral disc degeneration, thoracic region: Secondary | ICD-10-CM | POA: Diagnosis not present

## 2017-01-08 DIAGNOSIS — M546 Pain in thoracic spine: Secondary | ICD-10-CM | POA: Diagnosis not present

## 2017-01-08 DIAGNOSIS — M9902 Segmental and somatic dysfunction of thoracic region: Secondary | ICD-10-CM | POA: Diagnosis not present

## 2017-01-08 DIAGNOSIS — K5900 Constipation, unspecified: Secondary | ICD-10-CM | POA: Diagnosis not present

## 2017-01-08 DIAGNOSIS — M47814 Spondylosis without myelopathy or radiculopathy, thoracic region: Secondary | ICD-10-CM | POA: Diagnosis not present

## 2017-01-15 DIAGNOSIS — Z1211 Encounter for screening for malignant neoplasm of colon: Secondary | ICD-10-CM | POA: Diagnosis not present

## 2017-01-15 DIAGNOSIS — K317 Polyp of stomach and duodenum: Secondary | ICD-10-CM | POA: Diagnosis not present

## 2017-01-15 DIAGNOSIS — R1011 Right upper quadrant pain: Secondary | ICD-10-CM | POA: Diagnosis not present

## 2017-01-15 DIAGNOSIS — K5730 Diverticulosis of large intestine without perforation or abscess without bleeding: Secondary | ICD-10-CM | POA: Diagnosis not present

## 2017-01-15 DIAGNOSIS — K219 Gastro-esophageal reflux disease without esophagitis: Secondary | ICD-10-CM | POA: Diagnosis not present

## 2017-01-22 DIAGNOSIS — M9902 Segmental and somatic dysfunction of thoracic region: Secondary | ICD-10-CM | POA: Diagnosis not present

## 2017-01-22 DIAGNOSIS — M5134 Other intervertebral disc degeneration, thoracic region: Secondary | ICD-10-CM | POA: Diagnosis not present

## 2017-01-22 DIAGNOSIS — M47814 Spondylosis without myelopathy or radiculopathy, thoracic region: Secondary | ICD-10-CM | POA: Diagnosis not present

## 2017-01-22 DIAGNOSIS — M546 Pain in thoracic spine: Secondary | ICD-10-CM | POA: Diagnosis not present

## 2017-01-24 DIAGNOSIS — I10 Essential (primary) hypertension: Secondary | ICD-10-CM | POA: Diagnosis not present

## 2017-01-24 DIAGNOSIS — Z566 Other physical and mental strain related to work: Secondary | ICD-10-CM | POA: Diagnosis not present

## 2017-01-30 DIAGNOSIS — K5730 Diverticulosis of large intestine without perforation or abscess without bleeding: Secondary | ICD-10-CM | POA: Diagnosis not present

## 2017-01-30 DIAGNOSIS — K219 Gastro-esophageal reflux disease without esophagitis: Secondary | ICD-10-CM | POA: Diagnosis not present

## 2017-01-30 DIAGNOSIS — K5904 Chronic idiopathic constipation: Secondary | ICD-10-CM | POA: Diagnosis not present

## 2017-02-07 DIAGNOSIS — L814 Other melanin hyperpigmentation: Secondary | ICD-10-CM | POA: Diagnosis not present

## 2017-02-07 DIAGNOSIS — L821 Other seborrheic keratosis: Secondary | ICD-10-CM | POA: Diagnosis not present

## 2017-02-07 DIAGNOSIS — L820 Inflamed seborrheic keratosis: Secondary | ICD-10-CM | POA: Diagnosis not present

## 2017-02-07 DIAGNOSIS — L538 Other specified erythematous conditions: Secondary | ICD-10-CM | POA: Diagnosis not present

## 2017-02-13 DIAGNOSIS — I10 Essential (primary) hypertension: Secondary | ICD-10-CM | POA: Diagnosis not present

## 2017-02-13 DIAGNOSIS — Z79899 Other long term (current) drug therapy: Secondary | ICD-10-CM | POA: Diagnosis not present

## 2017-03-19 DIAGNOSIS — Z23 Encounter for immunization: Secondary | ICD-10-CM | POA: Diagnosis not present

## 2017-05-22 DIAGNOSIS — D485 Neoplasm of uncertain behavior of skin: Secondary | ICD-10-CM | POA: Diagnosis not present

## 2017-05-22 DIAGNOSIS — L821 Other seborrheic keratosis: Secondary | ICD-10-CM | POA: Diagnosis not present

## 2017-05-22 DIAGNOSIS — L538 Other specified erythematous conditions: Secondary | ICD-10-CM | POA: Diagnosis not present

## 2017-05-22 DIAGNOSIS — L820 Inflamed seborrheic keratosis: Secondary | ICD-10-CM | POA: Diagnosis not present

## 2017-06-01 ENCOUNTER — Other Ambulatory Visit: Payer: Self-pay | Admitting: Gastroenterology

## 2017-06-01 DIAGNOSIS — R935 Abnormal findings on diagnostic imaging of other abdominal regions, including retroperitoneum: Secondary | ICD-10-CM

## 2017-06-11 ENCOUNTER — Ambulatory Visit
Admission: RE | Admit: 2017-06-11 | Discharge: 2017-06-11 | Disposition: A | Discharge status: Home / Self Care | Payer: Medicare Other | Source: Ambulatory Visit | Attending: Gastroenterology | Admitting: Gastroenterology

## 2017-06-11 DIAGNOSIS — R935 Abnormal findings on diagnostic imaging of other abdominal regions, including retroperitoneum: Secondary | ICD-10-CM

## 2017-06-11 DIAGNOSIS — K5732 Diverticulitis of large intestine without perforation or abscess without bleeding: Secondary | ICD-10-CM | POA: Diagnosis not present

## 2017-06-25 DIAGNOSIS — Z1389 Encounter for screening for other disorder: Secondary | ICD-10-CM | POA: Diagnosis not present

## 2017-06-25 DIAGNOSIS — K581 Irritable bowel syndrome with constipation: Secondary | ICD-10-CM | POA: Diagnosis not present

## 2017-06-25 DIAGNOSIS — Z0000 Encounter for general adult medical examination without abnormal findings: Secondary | ICD-10-CM | POA: Diagnosis not present

## 2017-06-25 DIAGNOSIS — I10 Essential (primary) hypertension: Secondary | ICD-10-CM | POA: Diagnosis not present

## 2017-07-16 DIAGNOSIS — H04123 Dry eye syndrome of bilateral lacrimal glands: Secondary | ICD-10-CM | POA: Diagnosis not present

## 2017-07-16 DIAGNOSIS — H5310 Unspecified subjective visual disturbances: Secondary | ICD-10-CM | POA: Diagnosis not present

## 2017-08-10 ENCOUNTER — Encounter: Payer: Medicare Other | Admitting: Gynecology

## 2017-08-13 ENCOUNTER — Ambulatory Visit (INDEPENDENT_AMBULATORY_CARE_PROVIDER_SITE_OTHER): Payer: Medicare Other | Admitting: Gynecology

## 2017-08-13 ENCOUNTER — Encounter: Payer: Self-pay | Admitting: Gynecology

## 2017-08-13 VITALS — BP 136/80 | Ht 63.0 in | Wt 169.0 lb

## 2017-08-13 DIAGNOSIS — Z9189 Other specified personal risk factors, not elsewhere classified: Secondary | ICD-10-CM

## 2017-08-13 DIAGNOSIS — Z01411 Encounter for gynecological examination (general) (routine) with abnormal findings: Secondary | ICD-10-CM

## 2017-08-13 DIAGNOSIS — M8589 Other specified disorders of bone density and structure, multiple sites: Secondary | ICD-10-CM | POA: Diagnosis not present

## 2017-08-13 DIAGNOSIS — N952 Postmenopausal atrophic vaginitis: Secondary | ICD-10-CM

## 2017-08-13 DIAGNOSIS — N8189 Other female genital prolapse: Secondary | ICD-10-CM

## 2017-08-13 DIAGNOSIS — Z779 Other contact with and (suspected) exposures hazardous to health: Secondary | ICD-10-CM | POA: Diagnosis not present

## 2017-08-13 DIAGNOSIS — M8580 Other specified disorders of bone density and structure, unspecified site: Secondary | ICD-10-CM

## 2017-08-13 NOTE — Patient Instructions (Signed)
Follow-up in 1 year for annual exam, sooner if any issues. 

## 2017-08-13 NOTE — Progress Notes (Addendum)
    Tracy [name] [birth year] [id number]        67 y.o.  [id number] for annual gynecologic exam.  Without gynecologic complaints.  Past medical history,surgical history, problem list, medications, allergies, family history and social history were all reviewed and documented as reviewed in the EPIC chart.  ROS:  Performed with pertinent positives and negatives included in the history, assessment and plan.   Additional significant findings : None   Exam: [name] assistant Vitals:   08/13/17 1518  BP: 136/80  Weight: 169 lb (76.7 kg)  Height: 5\' 3"  (1.6 m)   Body mass index is 29.94 kg/m.  General appearance:  Normal affect, orientation and appearance. Skin: Grossly normal HEENT: Without gross lesions.  No cervical or supraclavicular adenopathy. Thyroid normal.  Lungs:  Clear without wheezing, rales or rhonchi Cardiac: RR, without RMG Abdominal:  Soft, nontender, without masses, guarding, rebound, organomegaly or hernia Breasts:  Examined lying and sitting without masses, retractions, discharge or axillary adenopathy. Pelvic:  Ext, BUS, Vagina: With atrophic changes.  Mild cystocele/rectocele  Cervix: With atrophic changes  Uterus: Anteverted, normal size, shape and contour, midline and mobile nontender   Adnexa: Without masses or tenderness    Anus and perineum: Normal   Rectovaginal: Normal sphincter tone without palpated masses or tenderness.    Assessment/Plan:  67 y.o. [id number] female for annual gynecologic exam.   1. Postmenopausal/atrophic genital changes.  No significant hot flushes, night sweats or any bleeding.  Is having issues with daily intermittent vaginal dryness.  Not sexually active.  Options for management to include vaginal moisturizers, vaginal estrogen supplementation, Osphena and vaginal laser discussed.  At this point the patient prefers vaginal moisturizers.  We will follow-up if symptoms worsen and wants to rediscuss treatment options. 2. Pelvic  relaxation.  Mild cystocele/rectocele.  She is feeling more pressure but also is having a lot of issues with constipation that she is dealing with through her GI.  Options for management were reviewed to include observation, pessary and surgery.  At this point the patient does not feel that her symptoms warrant intervention and will monitor.  I would not suggest considering surgery until her constipation is addressed. 3. Osteopenia.  DEXA 08/2016 T score -2.3.  FRAX 8.6% / 1.1%.  Recommend repeat DEXA next year at 2-year interval. 4. Pap smear/HPV 07/2014.  No Pap smear done today.  No history of abnormal Pap smears.  Recommend follow-up Pap smear at 5-year interval per current screening guidelines. 5. Mammography 09/2016.  Continue with annual mammography when due.  Breast exam normal today. 6. Colonoscopy 2018.  Repeat at their recommended interval. 7. Health maintenance.  No routine lab work done as patient reports this done elsewhere.  Follow-up 1 year, sooner as needed.  Additional time in excess of her breast and pelvic exam was spent in direct face to face counseling and coordination of care in regards to her vaginal dryness and pelvic relaxation.   Anastasio Auerbach MD, 3:46 PM 08/13/2017

## 2017-08-21 DIAGNOSIS — K5904 Chronic idiopathic constipation: Secondary | ICD-10-CM | POA: Diagnosis not present

## 2017-08-21 DIAGNOSIS — K5730 Diverticulosis of large intestine without perforation or abscess without bleeding: Secondary | ICD-10-CM | POA: Diagnosis not present

## 2017-08-21 DIAGNOSIS — R1033 Periumbilical pain: Secondary | ICD-10-CM | POA: Diagnosis not present

## 2017-09-04 DIAGNOSIS — R42 Dizziness and giddiness: Secondary | ICD-10-CM | POA: Diagnosis not present

## 2017-09-04 DIAGNOSIS — Z566 Other physical and mental strain related to work: Secondary | ICD-10-CM | POA: Diagnosis not present

## 2017-09-04 DIAGNOSIS — R002 Palpitations: Secondary | ICD-10-CM | POA: Diagnosis not present

## 2017-09-04 DIAGNOSIS — K581 Irritable bowel syndrome with constipation: Secondary | ICD-10-CM | POA: Diagnosis not present

## 2017-09-11 DIAGNOSIS — B349 Viral infection, unspecified: Secondary | ICD-10-CM | POA: Diagnosis not present

## 2017-09-11 DIAGNOSIS — J029 Acute pharyngitis, unspecified: Secondary | ICD-10-CM | POA: Diagnosis not present

## 2017-09-24 DIAGNOSIS — Z1231 Encounter for screening mammogram for malignant neoplasm of breast: Secondary | ICD-10-CM | POA: Diagnosis not present

## 2017-11-15 DIAGNOSIS — M545 Low back pain: Secondary | ICD-10-CM | POA: Diagnosis not present

## 2017-11-15 DIAGNOSIS — M9902 Segmental and somatic dysfunction of thoracic region: Secondary | ICD-10-CM | POA: Diagnosis not present

## 2017-11-15 DIAGNOSIS — M9903 Segmental and somatic dysfunction of lumbar region: Secondary | ICD-10-CM | POA: Diagnosis not present

## 2017-11-15 DIAGNOSIS — M9901 Segmental and somatic dysfunction of cervical region: Secondary | ICD-10-CM | POA: Diagnosis not present

## 2017-11-15 DIAGNOSIS — M546 Pain in thoracic spine: Secondary | ICD-10-CM | POA: Diagnosis not present

## 2017-11-15 DIAGNOSIS — M62830 Muscle spasm of back: Secondary | ICD-10-CM | POA: Diagnosis not present

## 2017-11-15 DIAGNOSIS — M5136 Other intervertebral disc degeneration, lumbar region: Secondary | ICD-10-CM | POA: Diagnosis not present

## 2017-11-19 DIAGNOSIS — M545 Low back pain: Secondary | ICD-10-CM | POA: Diagnosis not present

## 2017-11-19 DIAGNOSIS — M9901 Segmental and somatic dysfunction of cervical region: Secondary | ICD-10-CM | POA: Diagnosis not present

## 2017-11-19 DIAGNOSIS — M5136 Other intervertebral disc degeneration, lumbar region: Secondary | ICD-10-CM | POA: Diagnosis not present

## 2017-11-19 DIAGNOSIS — M9902 Segmental and somatic dysfunction of thoracic region: Secondary | ICD-10-CM | POA: Diagnosis not present

## 2017-11-19 DIAGNOSIS — M9903 Segmental and somatic dysfunction of lumbar region: Secondary | ICD-10-CM | POA: Diagnosis not present

## 2017-11-19 DIAGNOSIS — M546 Pain in thoracic spine: Secondary | ICD-10-CM | POA: Diagnosis not present

## 2017-11-19 DIAGNOSIS — M62830 Muscle spasm of back: Secondary | ICD-10-CM | POA: Diagnosis not present

## 2017-11-21 DIAGNOSIS — M25512 Pain in left shoulder: Secondary | ICD-10-CM | POA: Diagnosis not present

## 2017-11-22 DIAGNOSIS — M9902 Segmental and somatic dysfunction of thoracic region: Secondary | ICD-10-CM | POA: Diagnosis not present

## 2017-11-22 DIAGNOSIS — M545 Low back pain: Secondary | ICD-10-CM | POA: Diagnosis not present

## 2017-11-22 DIAGNOSIS — M9903 Segmental and somatic dysfunction of lumbar region: Secondary | ICD-10-CM | POA: Diagnosis not present

## 2017-11-22 DIAGNOSIS — M62830 Muscle spasm of back: Secondary | ICD-10-CM | POA: Diagnosis not present

## 2017-11-22 DIAGNOSIS — M9901 Segmental and somatic dysfunction of cervical region: Secondary | ICD-10-CM | POA: Diagnosis not present

## 2017-11-22 DIAGNOSIS — M5136 Other intervertebral disc degeneration, lumbar region: Secondary | ICD-10-CM | POA: Diagnosis not present

## 2017-11-22 DIAGNOSIS — M546 Pain in thoracic spine: Secondary | ICD-10-CM | POA: Diagnosis not present

## 2017-11-26 DIAGNOSIS — M545 Low back pain: Secondary | ICD-10-CM | POA: Diagnosis not present

## 2017-11-26 DIAGNOSIS — M62830 Muscle spasm of back: Secondary | ICD-10-CM | POA: Diagnosis not present

## 2017-11-26 DIAGNOSIS — M9903 Segmental and somatic dysfunction of lumbar region: Secondary | ICD-10-CM | POA: Diagnosis not present

## 2017-11-26 DIAGNOSIS — M9901 Segmental and somatic dysfunction of cervical region: Secondary | ICD-10-CM | POA: Diagnosis not present

## 2017-11-26 DIAGNOSIS — M5136 Other intervertebral disc degeneration, lumbar region: Secondary | ICD-10-CM | POA: Diagnosis not present

## 2017-11-26 DIAGNOSIS — M546 Pain in thoracic spine: Secondary | ICD-10-CM | POA: Diagnosis not present

## 2017-11-26 DIAGNOSIS — M9902 Segmental and somatic dysfunction of thoracic region: Secondary | ICD-10-CM | POA: Diagnosis not present

## 2017-11-29 DIAGNOSIS — M9902 Segmental and somatic dysfunction of thoracic region: Secondary | ICD-10-CM | POA: Diagnosis not present

## 2017-11-29 DIAGNOSIS — M546 Pain in thoracic spine: Secondary | ICD-10-CM | POA: Diagnosis not present

## 2017-11-29 DIAGNOSIS — M9903 Segmental and somatic dysfunction of lumbar region: Secondary | ICD-10-CM | POA: Diagnosis not present

## 2017-11-29 DIAGNOSIS — M5136 Other intervertebral disc degeneration, lumbar region: Secondary | ICD-10-CM | POA: Diagnosis not present

## 2017-11-29 DIAGNOSIS — M545 Low back pain: Secondary | ICD-10-CM | POA: Diagnosis not present

## 2017-11-29 DIAGNOSIS — M9901 Segmental and somatic dysfunction of cervical region: Secondary | ICD-10-CM | POA: Diagnosis not present

## 2017-11-29 DIAGNOSIS — M62830 Muscle spasm of back: Secondary | ICD-10-CM | POA: Diagnosis not present

## 2017-12-03 DIAGNOSIS — M9901 Segmental and somatic dysfunction of cervical region: Secondary | ICD-10-CM | POA: Diagnosis not present

## 2017-12-03 DIAGNOSIS — M5136 Other intervertebral disc degeneration, lumbar region: Secondary | ICD-10-CM | POA: Diagnosis not present

## 2017-12-03 DIAGNOSIS — M9902 Segmental and somatic dysfunction of thoracic region: Secondary | ICD-10-CM | POA: Diagnosis not present

## 2017-12-03 DIAGNOSIS — M545 Low back pain: Secondary | ICD-10-CM | POA: Diagnosis not present

## 2017-12-03 DIAGNOSIS — M62830 Muscle spasm of back: Secondary | ICD-10-CM | POA: Diagnosis not present

## 2017-12-03 DIAGNOSIS — M9903 Segmental and somatic dysfunction of lumbar region: Secondary | ICD-10-CM | POA: Diagnosis not present

## 2017-12-03 DIAGNOSIS — M546 Pain in thoracic spine: Secondary | ICD-10-CM | POA: Diagnosis not present

## 2017-12-05 DIAGNOSIS — M5136 Other intervertebral disc degeneration, lumbar region: Secondary | ICD-10-CM | POA: Diagnosis not present

## 2017-12-05 DIAGNOSIS — M545 Low back pain: Secondary | ICD-10-CM | POA: Diagnosis not present

## 2017-12-05 DIAGNOSIS — M9901 Segmental and somatic dysfunction of cervical region: Secondary | ICD-10-CM | POA: Diagnosis not present

## 2017-12-05 DIAGNOSIS — M9902 Segmental and somatic dysfunction of thoracic region: Secondary | ICD-10-CM | POA: Diagnosis not present

## 2017-12-05 DIAGNOSIS — M546 Pain in thoracic spine: Secondary | ICD-10-CM | POA: Diagnosis not present

## 2017-12-05 DIAGNOSIS — M62830 Muscle spasm of back: Secondary | ICD-10-CM | POA: Diagnosis not present

## 2017-12-05 DIAGNOSIS — M9903 Segmental and somatic dysfunction of lumbar region: Secondary | ICD-10-CM | POA: Diagnosis not present

## 2017-12-10 DIAGNOSIS — M62830 Muscle spasm of back: Secondary | ICD-10-CM | POA: Diagnosis not present

## 2017-12-10 DIAGNOSIS — M9902 Segmental and somatic dysfunction of thoracic region: Secondary | ICD-10-CM | POA: Diagnosis not present

## 2017-12-10 DIAGNOSIS — M545 Low back pain: Secondary | ICD-10-CM | POA: Diagnosis not present

## 2017-12-10 DIAGNOSIS — M5136 Other intervertebral disc degeneration, lumbar region: Secondary | ICD-10-CM | POA: Diagnosis not present

## 2017-12-10 DIAGNOSIS — M9903 Segmental and somatic dysfunction of lumbar region: Secondary | ICD-10-CM | POA: Diagnosis not present

## 2017-12-10 DIAGNOSIS — M9901 Segmental and somatic dysfunction of cervical region: Secondary | ICD-10-CM | POA: Diagnosis not present

## 2017-12-10 DIAGNOSIS — M546 Pain in thoracic spine: Secondary | ICD-10-CM | POA: Diagnosis not present

## 2017-12-13 DIAGNOSIS — M9902 Segmental and somatic dysfunction of thoracic region: Secondary | ICD-10-CM | POA: Diagnosis not present

## 2017-12-13 DIAGNOSIS — M546 Pain in thoracic spine: Secondary | ICD-10-CM | POA: Diagnosis not present

## 2017-12-13 DIAGNOSIS — M9903 Segmental and somatic dysfunction of lumbar region: Secondary | ICD-10-CM | POA: Diagnosis not present

## 2017-12-13 DIAGNOSIS — M9901 Segmental and somatic dysfunction of cervical region: Secondary | ICD-10-CM | POA: Diagnosis not present

## 2017-12-24 DIAGNOSIS — M546 Pain in thoracic spine: Secondary | ICD-10-CM | POA: Diagnosis not present

## 2017-12-24 DIAGNOSIS — M9901 Segmental and somatic dysfunction of cervical region: Secondary | ICD-10-CM | POA: Diagnosis not present

## 2017-12-24 DIAGNOSIS — M9902 Segmental and somatic dysfunction of thoracic region: Secondary | ICD-10-CM | POA: Diagnosis not present

## 2017-12-24 DIAGNOSIS — M9903 Segmental and somatic dysfunction of lumbar region: Secondary | ICD-10-CM | POA: Diagnosis not present

## 2018-01-07 DIAGNOSIS — M9901 Segmental and somatic dysfunction of cervical region: Secondary | ICD-10-CM | POA: Diagnosis not present

## 2018-01-07 DIAGNOSIS — M9903 Segmental and somatic dysfunction of lumbar region: Secondary | ICD-10-CM | POA: Diagnosis not present

## 2018-01-07 DIAGNOSIS — M546 Pain in thoracic spine: Secondary | ICD-10-CM | POA: Diagnosis not present

## 2018-01-07 DIAGNOSIS — M9902 Segmental and somatic dysfunction of thoracic region: Secondary | ICD-10-CM | POA: Diagnosis not present

## 2018-01-21 DIAGNOSIS — M9901 Segmental and somatic dysfunction of cervical region: Secondary | ICD-10-CM | POA: Diagnosis not present

## 2018-01-21 DIAGNOSIS — M9902 Segmental and somatic dysfunction of thoracic region: Secondary | ICD-10-CM | POA: Diagnosis not present

## 2018-01-21 DIAGNOSIS — M9903 Segmental and somatic dysfunction of lumbar region: Secondary | ICD-10-CM | POA: Diagnosis not present

## 2018-01-21 DIAGNOSIS — M546 Pain in thoracic spine: Secondary | ICD-10-CM | POA: Diagnosis not present

## 2018-02-18 DIAGNOSIS — M9902 Segmental and somatic dysfunction of thoracic region: Secondary | ICD-10-CM | POA: Diagnosis not present

## 2018-02-18 DIAGNOSIS — M9901 Segmental and somatic dysfunction of cervical region: Secondary | ICD-10-CM | POA: Diagnosis not present

## 2018-02-18 DIAGNOSIS — M9903 Segmental and somatic dysfunction of lumbar region: Secondary | ICD-10-CM | POA: Diagnosis not present

## 2018-02-18 DIAGNOSIS — M546 Pain in thoracic spine: Secondary | ICD-10-CM | POA: Diagnosis not present

## 2018-03-03 DIAGNOSIS — Z23 Encounter for immunization: Secondary | ICD-10-CM | POA: Diagnosis not present

## 2018-03-11 DIAGNOSIS — M546 Pain in thoracic spine: Secondary | ICD-10-CM | POA: Diagnosis not present

## 2018-03-11 DIAGNOSIS — M9901 Segmental and somatic dysfunction of cervical region: Secondary | ICD-10-CM | POA: Diagnosis not present

## 2018-03-11 DIAGNOSIS — M9902 Segmental and somatic dysfunction of thoracic region: Secondary | ICD-10-CM | POA: Diagnosis not present

## 2018-03-11 DIAGNOSIS — M9903 Segmental and somatic dysfunction of lumbar region: Secondary | ICD-10-CM | POA: Diagnosis not present

## 2018-03-19 DIAGNOSIS — R5383 Other fatigue: Secondary | ICD-10-CM | POA: Diagnosis not present

## 2018-03-19 DIAGNOSIS — R82998 Other abnormal findings in urine: Secondary | ICD-10-CM | POA: Diagnosis not present

## 2018-04-01 DIAGNOSIS — M546 Pain in thoracic spine: Secondary | ICD-10-CM | POA: Diagnosis not present

## 2018-04-01 DIAGNOSIS — M9901 Segmental and somatic dysfunction of cervical region: Secondary | ICD-10-CM | POA: Diagnosis not present

## 2018-04-01 DIAGNOSIS — M9902 Segmental and somatic dysfunction of thoracic region: Secondary | ICD-10-CM | POA: Diagnosis not present

## 2018-04-01 DIAGNOSIS — M9903 Segmental and somatic dysfunction of lumbar region: Secondary | ICD-10-CM | POA: Diagnosis not present

## 2018-04-16 ENCOUNTER — Other Ambulatory Visit: Payer: Self-pay | Admitting: Gynecology

## 2018-04-22 DIAGNOSIS — M9901 Segmental and somatic dysfunction of cervical region: Secondary | ICD-10-CM | POA: Diagnosis not present

## 2018-04-22 DIAGNOSIS — M9902 Segmental and somatic dysfunction of thoracic region: Secondary | ICD-10-CM | POA: Diagnosis not present

## 2018-04-22 DIAGNOSIS — M9903 Segmental and somatic dysfunction of lumbar region: Secondary | ICD-10-CM | POA: Diagnosis not present

## 2018-04-22 DIAGNOSIS — M546 Pain in thoracic spine: Secondary | ICD-10-CM | POA: Diagnosis not present

## 2018-07-03 DIAGNOSIS — L821 Other seborrheic keratosis: Secondary | ICD-10-CM | POA: Diagnosis not present

## 2018-07-03 DIAGNOSIS — R208 Other disturbances of skin sensation: Secondary | ICD-10-CM | POA: Diagnosis not present

## 2018-07-03 DIAGNOSIS — L820 Inflamed seborrheic keratosis: Secondary | ICD-10-CM | POA: Diagnosis not present

## 2018-07-03 DIAGNOSIS — D2261 Melanocytic nevi of right upper limb, including shoulder: Secondary | ICD-10-CM | POA: Diagnosis not present

## 2018-07-03 DIAGNOSIS — L538 Other specified erythematous conditions: Secondary | ICD-10-CM | POA: Diagnosis not present

## 2018-07-03 DIAGNOSIS — D2271 Melanocytic nevi of right lower limb, including hip: Secondary | ICD-10-CM | POA: Diagnosis not present

## 2018-07-03 DIAGNOSIS — L738 Other specified follicular disorders: Secondary | ICD-10-CM | POA: Diagnosis not present

## 2018-07-03 DIAGNOSIS — L814 Other melanin hyperpigmentation: Secondary | ICD-10-CM | POA: Diagnosis not present

## 2018-07-03 DIAGNOSIS — D225 Melanocytic nevi of trunk: Secondary | ICD-10-CM | POA: Diagnosis not present

## 2018-07-03 DIAGNOSIS — L298 Other pruritus: Secondary | ICD-10-CM | POA: Diagnosis not present

## 2018-07-05 DIAGNOSIS — Z1389 Encounter for screening for other disorder: Secondary | ICD-10-CM | POA: Diagnosis not present

## 2018-07-05 DIAGNOSIS — K5900 Constipation, unspecified: Secondary | ICD-10-CM | POA: Diagnosis not present

## 2018-07-05 DIAGNOSIS — I10 Essential (primary) hypertension: Secondary | ICD-10-CM | POA: Diagnosis not present

## 2018-07-05 DIAGNOSIS — Z0000 Encounter for general adult medical examination without abnormal findings: Secondary | ICD-10-CM | POA: Diagnosis not present

## 2018-07-10 DIAGNOSIS — K5730 Diverticulosis of large intestine without perforation or abscess without bleeding: Secondary | ICD-10-CM | POA: Diagnosis not present

## 2018-07-10 DIAGNOSIS — R1032 Left lower quadrant pain: Secondary | ICD-10-CM | POA: Diagnosis not present

## 2018-07-29 DIAGNOSIS — H35351 Cystoid macular degeneration, right eye: Secondary | ICD-10-CM | POA: Diagnosis not present

## 2018-07-29 DIAGNOSIS — H2513 Age-related nuclear cataract, bilateral: Secondary | ICD-10-CM | POA: Diagnosis not present

## 2018-08-15 ENCOUNTER — Encounter: Payer: Medicare Other | Admitting: Gynecology

## 2018-08-15 ENCOUNTER — Other Ambulatory Visit: Payer: Self-pay

## 2018-08-22 ENCOUNTER — Other Ambulatory Visit: Payer: Self-pay

## 2018-08-26 ENCOUNTER — Ambulatory Visit (INDEPENDENT_AMBULATORY_CARE_PROVIDER_SITE_OTHER): Payer: Medicare Other | Admitting: Gynecology

## 2018-08-26 ENCOUNTER — Other Ambulatory Visit: Payer: Self-pay

## 2018-08-26 ENCOUNTER — Encounter: Payer: Self-pay | Admitting: Gynecology

## 2018-08-26 VITALS — BP 118/78 | Ht 62.0 in | Wt 170.0 lb

## 2018-08-26 DIAGNOSIS — Z01419 Encounter for gynecological examination (general) (routine) without abnormal findings: Secondary | ICD-10-CM | POA: Diagnosis not present

## 2018-08-26 DIAGNOSIS — N952 Postmenopausal atrophic vaginitis: Secondary | ICD-10-CM

## 2018-08-26 DIAGNOSIS — M8580 Other specified disorders of bone density and structure, unspecified site: Secondary | ICD-10-CM | POA: Diagnosis not present

## 2018-08-26 DIAGNOSIS — N8189 Other female genital prolapse: Secondary | ICD-10-CM

## 2018-08-26 DIAGNOSIS — Z124 Encounter for screening for malignant neoplasm of cervix: Secondary | ICD-10-CM | POA: Diagnosis not present

## 2018-08-26 DIAGNOSIS — Z9289 Personal history of other medical treatment: Secondary | ICD-10-CM | POA: Diagnosis not present

## 2018-08-26 NOTE — Patient Instructions (Signed)
Follow-up in 1 year for annual exam, sooner as needed. 

## 2018-08-26 NOTE — Progress Notes (Signed)
    Tracy [name] Jun 06, 1950 [id number]        68 y.o.  [id number] for breast and pelvic exam.  Without gynecologic complaints  Past medical history,surgical history, problem list, medications, allergies, family history and social history were all reviewed and documented as reviewed in the EPIC chart.  ROS:  Performed with pertinent positives and negatives included in the history, assessment and plan.   Additional significant findings : None   Exam: [name] assistant Vitals:   08/26/18 1116  BP: 118/78  Weight: 170 lb (77.1 kg)  Height: 5\' 2"  (1.575 m)   Body mass index is 31.09 kg/m.  General appearance:  Normal affect, orientation and appearance. Skin: Grossly normal HEENT: Without gross lesions.  No cervical or supraclavicular adenopathy. Thyroid normal.  Lungs:  Clear without wheezing, rales or rhonchi Cardiac: RR, without RMG Abdominal:  Soft, nontender, without masses, guarding, rebound, organomegaly or hernia Breasts:  Examined lying and sitting without masses, retractions, discharge or axillary adenopathy. Pelvic:  Ext, BUS, Vagina: Normal with atrophic changes.  Mild cystocele/rectocele  Cervix: Normal with atrophic changes  Uterus: Anteverted, normal size, shape and contour, midline and mobile nontender   Adnexa: Without masses or tenderness    Anus and perineum: Normal   Rectovaginal: Normal sphincter tone without palpated masses or tenderness.    Assessment/Plan:  68 y.o. [id number] female for breast and pelvic exam  1. Postmenopausal.  No significant menopausal symptoms or any vaginal bleeding. 2. Pelvic relaxation.  Mild cystocele/rectocele.  Stable on exam.  Continue to monitor and follow-up if symptoms develop that are overly bothersome to the patient. 3. Mammography 09/2017.  Schedule follow-up mammogram next month.  Breast exam normal today. 4. Colonoscopy 2018.  Repeat at their recommended interval. 5. Osteopenia.  DEXA 2018 T score -2.3 FRAX 8.6% / 1.1%.   Recommend follow-up DEXA now at 2-year interval and she agrees to schedule. 6. Pap smear/HPV 07/2014.  Pap smear done today.  No history of abnormal Pap smears previously. 7. Health maintenance.  No routine lab work done as patient does this elsewhere.  Follow-up 1 year, sooner as needed.   Anastasio Auerbach MD, 12:02 PM 08/26/2018

## 2018-08-26 NOTE — Addendum Note (Signed)
Addended by: Nelva Nay on: 08/26/2018 12:18 PM   Modules accepted: Orders

## 2018-08-27 LAB — PAP IG W/ RFLX HPV ASCU

## 2018-12-02 DIAGNOSIS — H35341 Macular cyst, hole, or pseudohole, right eye: Secondary | ICD-10-CM | POA: Diagnosis not present

## 2018-12-04 DIAGNOSIS — M810 Age-related osteoporosis without current pathological fracture: Secondary | ICD-10-CM

## 2018-12-04 HISTORY — DX: Age-related osteoporosis without current pathological fracture: M81.0

## 2018-12-05 ENCOUNTER — Ambulatory Visit (INDEPENDENT_AMBULATORY_CARE_PROVIDER_SITE_OTHER): Payer: Medicare Other

## 2018-12-05 ENCOUNTER — Other Ambulatory Visit: Payer: Self-pay | Admitting: Gynecology

## 2018-12-05 ENCOUNTER — Other Ambulatory Visit: Payer: Self-pay

## 2018-12-05 DIAGNOSIS — Z780 Asymptomatic menopausal state: Secondary | ICD-10-CM

## 2018-12-05 DIAGNOSIS — M810 Age-related osteoporosis without current pathological fracture: Secondary | ICD-10-CM | POA: Diagnosis not present

## 2018-12-05 DIAGNOSIS — K5904 Chronic idiopathic constipation: Secondary | ICD-10-CM | POA: Diagnosis not present

## 2018-12-05 DIAGNOSIS — R194 Change in bowel habit: Secondary | ICD-10-CM | POA: Diagnosis not present

## 2018-12-05 DIAGNOSIS — R140 Abdominal distension (gaseous): Secondary | ICD-10-CM | POA: Diagnosis not present

## 2018-12-05 DIAGNOSIS — M8580 Other specified disorders of bone density and structure, unspecified site: Secondary | ICD-10-CM

## 2018-12-09 ENCOUNTER — Encounter: Payer: Self-pay | Admitting: Gynecology

## 2018-12-09 DIAGNOSIS — Z1231 Encounter for screening mammogram for malignant neoplasm of breast: Secondary | ICD-10-CM | POA: Diagnosis not present

## 2018-12-18 ENCOUNTER — Other Ambulatory Visit: Payer: Self-pay

## 2018-12-18 ENCOUNTER — Encounter: Payer: Self-pay | Admitting: Gynecology

## 2018-12-18 ENCOUNTER — Ambulatory Visit (INDEPENDENT_AMBULATORY_CARE_PROVIDER_SITE_OTHER): Payer: Medicare Other | Admitting: Gynecology

## 2018-12-18 DIAGNOSIS — M810 Age-related osteoporosis without current pathological fracture: Secondary | ICD-10-CM

## 2018-12-18 NOTE — Patient Instructions (Signed)
Office will call you to arrange appointment with Dr Cruzita Lederer for consultation about your osteoporosis

## 2018-12-18 NOTE — Progress Notes (Signed)
    Tracy [name] 1950-11-19 [id number]        68 y.o.  [id number] patient calls for telemedicine encounter in reference to her most recent bone density showing osteoporosis.  She was identified by 2 identifiers and understands the limitations of a telemedicine encounter.  She is at home and I am my office.  Her most recent bone density shows T score -2.5 left total hip.  Compared to her 2016 study she had a 6.9% statistically significant loss.  When compared to her 2018 study all measurements were stable without any loss exceedingly significant change.  She relates a healthy lifestyle as far as eating but most recently has not been able to participate in weightbearing exercise on a regular basis.  She transiently took Fosamax number of years ago due to a significant osteopenia but was unable to tolerate it due to GI side effects.  I do not have copies of those office notes.  Past medical history,surgical history, problem list, medications, allergies, family history and social history were all reviewed and documented in the EPIC chart.  Directed ROS with pertinent positives and negatives documented in the history of present illness/assessment and plan.  Assessment/Plan:  68 y.o. [id number] with osteoporosis T score -2.5.  Overall stable from her prior study.  We discussed the significance of osteoporosis with increased fracture risk as well as the potential for ongoing bone loss and increasing fracture risk with aging.  We discussed treatment options to include behavior modification as far as weightbearing exercise, adequate calcium and vitamin D intake as well as having a vitamin D level checked at her primary physician's office or through our office at her choice.  Medication treatments were also reviewed to include bisphosphate's considering Reclast given her GI upset history as well as currently being treated for GI issues by Dr. Collene Mares.  Other options to include Prolia, Evista, Evinity and Forteo were also  reviewed.  The pros and cons of each choice discussed.  We also reviewed the risks including GERD, osteonecrosis of the jaw, atypical fractures, rashes, infections, cataracts, thrombosis, osteosarcoma discussed.  She is being followed for some retinal issues as well as bone loss in her jaw and is concerned about the issues of osteonecrosis of the jaw.  After lengthy discussion my recommendation would be a consult with Dr Cruzita Lederer for her input and recommendations as far as treatment.  The patient welcomes this referral and we will go ahead and make the arrangements.    Anastasio Auerbach MD, 4:33 PM 12/18/2018

## 2018-12-19 ENCOUNTER — Encounter: Payer: Self-pay | Admitting: *Deleted

## 2018-12-19 ENCOUNTER — Telehealth: Payer: Self-pay | Admitting: *Deleted

## 2018-12-19 DIAGNOSIS — M810 Age-related osteoporosis without current pathological fracture: Secondary | ICD-10-CM

## 2018-12-19 NOTE — Telephone Encounter (Addendum)
Referral placed at [facility] they will call to schedule , copy of dexa faxed to PCP Dr. Laurann Montana office [phone]. Sent my chart message to patient with this information as well.

## 2018-12-19 NOTE — Telephone Encounter (Signed)
-----   Message from Anastasio Auerbach, MD sent at 12/18/2018  4:39 PM EDT ----- 1.  Referral to Dr Cruzita Lederer reference osteoporosis2.  Forward a copy of her most recent DEXA to Dr. Laurann Montana her primary physician

## 2019-01-08 NOTE — Telephone Encounter (Signed)
I called patient and told her Tracy [name] has called and left message to call and schedule. patient said she never heard from them, I gave her the # and asked if she would call to schedule.

## 2019-01-21 NOTE — Telephone Encounter (Signed)
Patient scheduled on 02/24/19 @ 8:15am

## 2019-02-03 DIAGNOSIS — H04123 Dry eye syndrome of bilateral lacrimal glands: Secondary | ICD-10-CM | POA: Diagnosis not present

## 2019-02-20 ENCOUNTER — Other Ambulatory Visit: Payer: Self-pay

## 2019-02-24 ENCOUNTER — Other Ambulatory Visit: Payer: Self-pay

## 2019-02-24 ENCOUNTER — Encounter: Payer: Self-pay | Admitting: Internal Medicine

## 2019-02-24 ENCOUNTER — Ambulatory Visit (INDEPENDENT_AMBULATORY_CARE_PROVIDER_SITE_OTHER): Payer: Medicare Other | Admitting: Internal Medicine

## 2019-02-24 VITALS — BP 138/80 | HR 85 | Ht 62.25 in | Wt 171.0 lb

## 2019-02-24 DIAGNOSIS — M810 Age-related osteoporosis without current pathological fracture: Secondary | ICD-10-CM | POA: Diagnosis not present

## 2019-02-24 LAB — VITAMIN D 25 HYDROXY (VIT D DEFICIENCY, FRACTURES): VITD: 29.29 ng/mL — ABNORMAL LOW (ref 30.00–100.00)

## 2019-02-24 NOTE — Patient Instructions (Addendum)
Please stop at the lab.  Please come back for a follow-up appointment in 1 year.  How Can I Prevent Falls? Men and women with osteoporosis need to take care not to fall down. Falls can break bones. Some reasons people fall are: Poor vision  Poor balance  Certain diseases that affect how you walk  Some types of medicine, such as sleeping pills.  Some tips to help prevent falls outdoors are: Use a cane or walker  Wear rubber-soled shoes so you don't slip  Walk on grass when sidewalks are slippery  In winter, put salt or kitty litter on icy sidewalks.  Some ways to help prevent falls indoors are: Keep rooms free of clutter, especially on floors  Use plastic or carpet runners on slippery floors  Wear low-heeled shoes that provide good support  Do not walk in socks, stockings, or slippers  Be sure carpets and area rugs have skid-proof backs or are tacked to the floor  Be sure stairs are well lit and have rails on both sides  Put grab bars on bathroom walls near tub, shower, and toilet  Use a rubber bath mat in the shower or tub  Keep a flashlight next to your bed  Use a sturdy step stool with a handrail and wide steps  Add more lights in rooms (and night lights) Buy a cordless phone to keep with you so that you don't have to rush to the phone       when it rings and so that you can call for help if you fall.   (adapted from http://www.niams.Nightlife[employer]views.se)  Please check out the following book about best diet for bone health:   Exercise for Strong Bones (from [employer]) There are two types of exercises that are important for building and maintaining bone density:  weight-bearing and muscle-strengthening exercises. Weight-bearing Exercises These exercises include activities that make you move against gravity while staying upright. Weight-bearing exercises can be high-impact or low-impact. High-impact weight-bearing  exercises help build bones and keep them strong. If you have broken a bone due to osteoporosis or are at risk of breaking a bone, you may need to avoid high-impact exercises. If you're not sure, you should check with your healthcare provider. Examples of high-impact weight-bearing exercises are: . Dancing . Doing high-impact aerobics . Hiking . Jogging/running . Jumping Rope . Stair climbing . Tennis Low-impact weight-bearing exercises can also help keep bones strong and are a safe alternative if you cannot do high-impact exercises. Examples of low-impact weight-bearing exercises are: . Using elliptical training machines . Doing low-impact aerobics . Using stair-step machines . Fast walking on a treadmill or outside Muscle-Strengthening Exercises These exercises include activities where you move your body, a weight or some other resistance against gravity. They are also known as resistance exercises and include: . Lifting weights . Using elastic exercise bands . Using weight machines . Lifting your own body weight . Functional movements, such as standing and rising up on your toes Yoga and Pilates can also improve strength, balance and flexibility. However, certain positions may not be safe for people with osteoporosis or those at increased risk of broken bones. For example, exercises that have you bend forward may increase the chance of breaking a bone in the spine. A physical therapist should be able to help you learn which exercises are safe and appropriate for you. Non-Impact Exercises Non-impact exercises can help you to improve balance, posture and how well you move in everyday activities. These  exercises can also help to increase muscle strength and decrease the risk of falls and broken bones. Some of these exercises include: . Balance exercises that strengthen your legs and test your balance, such as Tai Chi, can decrease your risk of falls. . Posture exercises that improve your  posture and reduce rounded or "sloping" shoulders can help you decrease the chance of breaking a bone, especially in the spine. . Functional exercises that improve how well you move can help you with everyday activities and decrease your chance of falling and breaking a bone. For example, if you have trouble getting up from a chair or climbing stairs, you should do these activities as exercises. A physical therapist can teach you balance, posture and functional exercises. Starting a New Exercise Program If you haven't exercised regularly for a while, check with your healthcare provider before beginning a new exercise program-particularly if you have health problems such as heart disease, diabetes or high blood pressure. If you're at high risk of breaking a bone, you should work with a physical therapist to develop a safe exercise program. Once you have your healthcare provider's approval, start slowly. If you've already broken bones in the spine because of osteoporosis, be very careful to avoid activities that require reaching down, bending forward, rapid twisting motions, heavy lifting and those that increase your chance of a fall. As you get started, your muscles may feel sore for a day or two after you exercise. If soreness lasts longer, you may be working too hard and need to ease up. Exercises should be done in a pain-free range of motion. How Much Exercise Do You Need? Weight-bearing exercises 30 minutes on most days of the week. Do a 30-minutesession or multiple sessions spread out throughout the day. The benefits to your bones are the same.   Muscle-strengthening exercises Two to three days per week. If you don't have much time for strengthening/resistance training, do small amounts at a time. You can do just one body part each day. For example do arms one day, legs the next and trunk the next. You can also spread these exercises out during your normal day.  Balance, posture and functional  exercises Every day or as often as needed. You may want to focus on one area more than the others. If you have fallen or lose your balance, spend time doing balance exercises. If you are getting rounded shoulders, work more on posture exercises. If you have trouble climbing stairs or getting up from the couch, do more functional exercises. You can also perform these exercises at one time or spread them during your day. Work with a phyiscal therapist to learn the right exercises for you.

## 2019-02-24 NOTE — Progress Notes (Signed)
Patient ID: Tracy [name], female   DOB: 03-09-1951, 68 y.o.   MRN: [id number]    HPI  Tracy [name] is a 68 y.o.-year-old female, referred by Dr. Phineas Real, for management of osteoporosis (OP).  Pt was dx with OP in 2020.  I reviewed pt's DXA scans: Date L1-L4 T score FN T score Total Hip T score   12/05/2018 ([facility])  -1.5 (-0.2%) RFN: -1.3 (+15.2%*) LFN: -2.1 (+0.7%) RTH: -2.0 (-0.8%) LTH: -2.5 (-2.5%)  08/15/2016 ([facility])  -1.5 RFN: -2.2 LFN: -2.2 RTH: -1.9 (+0.4%) LTH: -2.3 (-4.5%*)  07/27/2014 ([facility])  -1.4 RFN: -2.2 LFN: -2.2 RTH: -2.0 LTH: -2.1   She denies fractures.  No falls.  No dizziness/orthostasis/poor vision.  She had an episode of vertigo in 2013 - resolved. Sees Dr. Valetta Close - ophthalmology - "hole in the retina".  Previous OP treatments:  -Fosamax (few years ago) >> GI side effects (GERD).  Of note, she had jaw bone loss. She has bruxism - has night guard. 3 teeth broken.  No h/o vitamin D deficiency. Reviewed available vit D levels: No results found for: VD25OH  Pt is on: - vitamin D  1000 units daily  No weight bearing exercises.  She walks or does stretching/yoga  She does not take high vitamin A doses.  Menopause was at 68 y/o.   + FH of osteoporosis - mother with pubic bone hairline fx.  No h/o hyper/hypocalcemia or hyperparathyroidism. No h/o kidney stones. No results found for: PTH, CALCIUM   No h/o thyrotoxicosis. No recent TSH levels available for review:  No results found for: TSH   No h/o CKD.  Now BUN/creatinine and Y view No results found for: BUN, CREATININE   No RA - saw Dr. Estanislado Pandy -she has osteoarthritis.  She sees a Restaurant manager, fast food. She has low back pain.  She also has a history of hypertension.  She also sees Dr. Collene Mares.  ROS: Constitutional: + Weight gain, no weight loss, + fatigue, no subjective hyperthermia, no subjective hypothermia, no nocturia Eyes: + Blurry vision (right eye), no xerophthalmia ENT: + Sore throat, no  nodules palpated in neck, no dysphagia, no odynophagia, no hoarseness, + tinnitus, + hypoacusis Cardiovascular: no CP, no SOB, + palpitations, no leg swelling Respiratory: no cough, no SOB, no wheezing Gastrointestinal: no N, no V, no D, + C, + acid reflux Musculoskeletal: no muscle, + joint aches Skin: no rash, + hair loss Neurological: no tremors, no numbness or tingling/no dizziness/no HAs Psychiatric: no depression, no anxiety  I reviewed pt's medications, allergies, PMH, social hx, family hx, and changes were documented in the history of present illness. Otherwise, unchanged from my initial visit note.  Past Medical History:  Diagnosis Date  . ASCUS favor benign 2000   negative pap smears since  . Constipation   . Diverticulitis   . HSV (herpes simplex virus) anogenital infection   . IBS (irritable bowel syndrome)   . Postmenopausal osteoporosis 12/2018   T score -2.5   Past Surgical History:  Procedure Laterality Date  . COLON SURGERY  2012  . Uterine polyp     Social History   Socioeconomic History  . Marital status: Single    Spouse name: Not on file  . Number of children: 1 - son 57 y/o in 02/2019 -she is soon to become a grandmother  . Years of education: Not on file  . Highest education level: Not on file  Occupational History  . Not on file  Social Needs  .  Financial resource strain: Not on file  . Food insecurity    Worry: Not on file    Inability: Not on file  . Transportation needs    Medical: Not on file    Non-medical: Not on file  Tobacco Use  . Smoking status: Former Research scientist (life sciences), quit in 1975  . Smokeless tobacco: Never Used  . Tobacco comment: QUIT 1974  Substance and Sexual Activity  . Alcohol use: Yes- - 2 glasses 4-7 times a week      . Drug use: No   Current Outpatient Medications on File Prior to Visit  Medication Sig Dispense Refill  . Cholecalciferol (VITAMIN D PO) Take by mouth.    . Flaxseed, Linseed, (FLAX SEEDS PO) Take by mouth.     . linaCLOtide (LINZESS PO) Take by mouth.    . losartan (COZAAR) 50 MG tablet Take 50 mg by mouth daily.    . valACYclovir (VALTREX) 500 MG tablet Take 1 tablet (500 mg total) by mouth 2 (two) times daily. For 5 days with outbreaks 30 tablet 2   No current facility-administered medications on file prior to visit.    No Known Allergies Family History  Problem Relation Age of Onset  . Heart attack Father   . Lung disease Father   . Cancer Maternal Grandmother        Uterine  . Heart failure Sister   Mother died of perforated bowel at 79. Father died at 57.  PE: There were no vitals taken for this visit. Wt Readings from Last 3 Encounters:  08/26/18 170 lb (77.1 kg)  08/13/17 169 lb (76.7 kg)  08/09/16 168 lb (76.2 kg)   Constitutional: overweight, in NAD. No kyphosis. Eyes: PERRLA, EOMI, no exophthalmos ENT: moist mucous membranes, no thyromegaly, no cervical lymphadenopathy Cardiovascular: RRR, No MRG Respiratory: CTA B Gastrointestinal: abdomen soft, NT, ND, BS+ Musculoskeletal: no deformities, strength intact in all 4 Skin: moist, warm, no rashes Neurological: no tremor with outstretched hands, DTR normal in all 4  Assessment: 1. Osteoporosis  Plan: 1. Osteoporosis - likely postmenopausal/age-related, she has FH of OP (mother with pubic bone fracture) - Discussed about increased risk of fracture, depending on the T score, greatly increased when the T score is lower than -2.5, but it is actually a continuum and -2.5 should not be regarded as an absolute threshold. We reviewed her last 3 DXA scan reports and images together, and I explained that based on the T scores, she has an increased risk for fractures.  However, the last BMD report shows unusual T-scores at the level of her right femoral neck, discrepant with the right total hip.  Otherwise, over the years, her bone density scans were stable. - we reviewed her dietary and supplemental calcium and vitamin D intake. I  recommended to make sure she gets 1000-1200 mg of calcium daily preferentially from diet and I will check vit D today to see if she needs supplementation.  She is currently taking 1000 units daily.  She is not taking multivitamins. - discussed fall precautions   - given handout from [facility]: weight bearing exercises - advised to start doing these every day or at least 5/7 days - we discussed about maintaining a good amount of protein in her diet. The recommended daily protein intake is ~0.8 g per kilogram per day. I advised her to try to aim for at least 50 g/day, since a diet low in proteins can exacerbate osteoporosis.  She avoids smoking and  does not drink more than 2 drinks of alcohol a day. - I recommended the following book for the concept of low acid eating and explained the concept:  - We discussed about the different medication classes, benefits and side effects (including atypical fractures and ONJ) - I explained that first options are sq denosumab (Prolia) sq every 6 months and zoledronic acid (Reclast) iv 1x a year. I would use Teriparatide/Abaloparatide (which are daily subcu medication) or Romosozumab (monthly) as a last resort.  However, for now, we decided to optimize her vitamin D, diet, exercise, especially since her last bone density was different and I feel that this was not completely reliable.  We did discuss that with her history of bone loss in the right side of her jaw we need to be careful with ONJ, but this usually happens after several years of treatments and mostly in patients that use higher doses more frequently (cancer patients). - Will check the following labs today: Orders Placed This Encounter  Procedures  . VITAMIN D 25 Hydroxy (Vit-D Deficiency, Fractures)  . BASIC METABOLIC PANEL WITH GFR  - will see pt back in a year -We will check another bone density scan in 2 years and will decide about treatment at that time.  Component      Latest Ref Rng & Units 02/24/2019  Glucose     65 - 99 mg/dL 100 (H)  BUN     7 - 25 mg/dL 17  Creatinine     0.50 - 0.99 mg/dL 0.70  GFR, Est Non African American     > OR = 60 mL/min/1.49m2 88  GFR, Est African American     > OR = 60 mL/min/1.19m2 102  BUN/Creatinine Ratio     6 - 22 (calc) NOT APPLICABLE  Sodium     [id number] - 146 mmol/L 141  Potassium     3.5 - 5.3 mmol/L 4.8  Chloride     98 - 110 mmol/L 107  CO2     20 - 32 mmol/L 26  Calcium     8.6 - 10.4 mg/dL 10.0  VITD     30.00 - 100.00 ng/mL 29.29 (L)   Labs are normal except a slightly low vitamin D.  We will increase her vitamin D supplement to 2000 units daily.  She will need another level in 2 months.  Philemon Kingdom, MD PhD [facility]

## 2019-02-25 LAB — BASIC METABOLIC PANEL WITH GFR
BUN: 17 mg/dL (ref 7–25)
CO2: 26 mmol/L (ref 20–32)
Calcium: 10 mg/dL (ref 8.6–10.4)
Chloride: 107 mmol/L (ref 98–110)
Creat: 0.7 mg/dL (ref 0.50–0.99)
GFR, Est African American: 102 mL/min/{1.73_m2} (ref >=60)
GFR, Est Non African American: 88 mL/min/{1.73_m2} (ref >=60)
Glucose, Bld: 100 mg/dL — ABNORMAL HIGH (ref 65–99)
Potassium: 4.8 mmol/L (ref 3.5–5.3)
Sodium: 141 mmol/L (ref 135–146)

## 2019-03-03 DIAGNOSIS — H04123 Dry eye syndrome of bilateral lacrimal glands: Secondary | ICD-10-CM | POA: Diagnosis not present

## 2019-03-03 DIAGNOSIS — H35341 Macular cyst, hole, or pseudohole, right eye: Secondary | ICD-10-CM | POA: Diagnosis not present

## 2019-03-03 DIAGNOSIS — Z23 Encounter for immunization: Secondary | ICD-10-CM | POA: Diagnosis not present

## 2019-03-12 ENCOUNTER — Encounter: Payer: Self-pay | Admitting: Gynecology

## 2019-04-14 DIAGNOSIS — H04121 Dry eye syndrome of right lacrimal gland: Secondary | ICD-10-CM | POA: Diagnosis not present

## 2019-04-14 DIAGNOSIS — H04122 Dry eye syndrome of left lacrimal gland: Secondary | ICD-10-CM | POA: Diagnosis not present

## 2019-07-04 ENCOUNTER — Ambulatory Visit: Payer: Medicare Other

## 2019-07-08 DIAGNOSIS — I10 Essential (primary) hypertension: Secondary | ICD-10-CM | POA: Diagnosis not present

## 2019-07-08 DIAGNOSIS — M810 Age-related osteoporosis without current pathological fracture: Secondary | ICD-10-CM | POA: Diagnosis not present

## 2019-07-08 DIAGNOSIS — Z136 Encounter for screening for cardiovascular disorders: Secondary | ICD-10-CM | POA: Diagnosis not present

## 2019-07-08 DIAGNOSIS — Z0000 Encounter for general adult medical examination without abnormal findings: Secondary | ICD-10-CM | POA: Diagnosis not present

## 2019-07-08 DIAGNOSIS — Z1389 Encounter for screening for other disorder: Secondary | ICD-10-CM | POA: Diagnosis not present

## 2019-07-08 DIAGNOSIS — Z1159 Encounter for screening for other viral diseases: Secondary | ICD-10-CM | POA: Diagnosis not present

## 2019-07-12 ENCOUNTER — Ambulatory Visit: Payer: Medicare HMO | Attending: Internal Medicine

## 2019-07-12 DIAGNOSIS — Z23 Encounter for immunization: Secondary | ICD-10-CM

## 2019-07-12 NOTE — Progress Notes (Signed)
   Covid-19 Vaccination Clinic  Name:  Tracy [name]    MRN: [id number] DOB: 1950-11-15  07/12/2019  [name] was observed post Covid-19 immunization for 15 minutes without incidence. She was provided with Vaccine Information Sheet and instruction to access the V-Safe system.   [name] was instructed to call 911 with any severe reactions post vaccine: Marland Kitchen Difficulty breathing  . Swelling of your face and throat  . A fast heartbeat  . A bad rash all over your body  . Dizziness and weakness    Immunizations Administered    Name Date Dose VIS Date Route   Pfizer COVID-19 Vaccine 07/12/2019  3:27 PM 0.3 mL 05/16/2019 Intramuscular   Manufacturer: [place]   Lot: [id number]   [place]: [id number]

## 2019-07-14 DIAGNOSIS — S0502XA Injury of conjunctiva and corneal abrasion without foreign body, left eye, initial encounter: Secondary | ICD-10-CM | POA: Diagnosis not present

## 2019-07-18 DIAGNOSIS — S0502XD Injury of conjunctiva and corneal abrasion without foreign body, left eye, subsequent encounter: Secondary | ICD-10-CM | POA: Diagnosis not present

## 2019-07-25 ENCOUNTER — Ambulatory Visit: Payer: Medicare Other

## 2019-08-06 ENCOUNTER — Ambulatory Visit: Payer: Medicare HMO | Attending: Internal Medicine

## 2019-08-06 DIAGNOSIS — Z23 Encounter for immunization: Secondary | ICD-10-CM | POA: Insufficient documentation

## 2019-08-06 NOTE — Progress Notes (Signed)
   Covid-19 Vaccination Clinic  Name:  Tracy [name]    MRN: [id number] DOB: 1951-04-20  08/06/2019  [name] was observed post Covid-19 immunization for 15 minutes without incident. She was provided with Vaccine Information Sheet and instruction to access the V-Safe system.   [name] was instructed to call 911 with any severe reactions post vaccine: Marland Kitchen Difficulty breathing  . Swelling of face and throat  . A fast heartbeat  . A bad rash all over body  . Dizziness and weakness   Immunizations Administered    Name Date Dose VIS Date Route   Pfizer COVID-19 Vaccine 08/06/2019 11:07 AM 0.3 mL 05/16/2019 Intramuscular   Manufacturer: [place]   Lot: [id number]   [place]: [id number]

## 2019-08-15 ENCOUNTER — Telehealth: Payer: Self-pay | Admitting: *Deleted

## 2019-08-15 MED ORDER — VALACYCLOVIR HCL 500 MG PO TABS: 500.0000 mg | ORAL_TABLET | Freq: Two times a day (BID) | ORAL | 0 refills | Status: DC

## 2019-08-15 NOTE — Telephone Encounter (Signed)
Patient has annual exam scheduled on 08/27/19, needs refills on Valtrex 500 mg tablet currently having outbreak. Rx sent.

## 2019-08-26 ENCOUNTER — Other Ambulatory Visit: Payer: Self-pay

## 2019-08-27 ENCOUNTER — Encounter: Payer: Self-pay | Admitting: Women's Health

## 2019-08-27 ENCOUNTER — Ambulatory Visit: Payer: Medicare HMO | Admitting: Women's Health

## 2019-08-27 VITALS — BP 120/82 | Ht 62.0 in | Wt 151.0 lb

## 2019-08-27 DIAGNOSIS — R3121 Asymptomatic microscopic hematuria: Secondary | ICD-10-CM | POA: Diagnosis not present

## 2019-08-27 DIAGNOSIS — Z01419 Encounter for gynecological examination (general) (routine) without abnormal findings: Secondary | ICD-10-CM

## 2019-08-27 DIAGNOSIS — Z9289 Personal history of other medical treatment: Secondary | ICD-10-CM

## 2019-08-27 NOTE — Progress Notes (Signed)
Tracy [name] 21-Mar-1951 [id number]    History:    Presents for annual exam.  Postmenopausal no HRT with no bleeding.  Not sexually active.  History of a mild cystocele asymptomatic.  2020 T score -1.5 at spine, hip -2.5 stable, Dr Orpah Greek managing.  2018  colonoscopy no polyps, diverticulosis 2012 bowel resection.  Currently struggling with IBS-C and upper abdominal discomfort, problem with  frequent stools with Linzess.  Primary care manages hypertension, and osteoporosis  Past medical history, past surgical history, family history and social history were all reviewed and documented in the EPIC chart.  Works at [employer] work.    1 son and 2 grandchildren.  Originally from [place].  ROS:  A ROS was performed and pertinent positives and negatives are included.  Exam:  Vitals:   08/27/19 0835  BP: 120/82  Weight: 151 lb (68.5 kg)  Height: 5\' 2"  (1.575 m)   Body mass index is 27.62 kg/m.   General appearance:  Normal Thyroid:  Symmetrical, normal in size, without palpable masses or nodularity. Respiratory  Auscultation:  Clear without wheezing or rhonchi Cardiovascular  Auscultation:  Regular rate, without rubs, murmurs or gallops  Edema/varicosities:  Not grossly evident Abdominal  Soft,nontender, without masses, guarding or rebound.  Liver/spleen:  No organomegaly noted  Hernia:  None appreciated  Skin  Inspection:  Grossly normal   Breasts: Examined lying and sitting.     Right: Without masses, retractions, discharge or axillary adenopathy.     Left: Without masses, retractions, discharge or axillary adenopathy. Gentitourinary   Inguinal/mons:  Normal without inguinal adenopathy  External genitalia:  Normal  BUS/Urethra/Skene's glands:  Normal  Vagina: +1 cystocele             Cervix:  Normal  Uterus:   normal in size, shape and contour.  Midline and mobile  Adnexa/parametria:     Rt: Without masses or tenderness.   Lt: Without masses or  tenderness.  Anus and perineum: Normal  Digital rectal exam: Normal sphincter tone without palpated masses or tenderness  Assessment/Plan:  69 y.o. DWF G3 P1 for breast and pelvic exam.  Postmenopausal no HRT with no bleeding Osteoporosis-endocrinologist managing Hypertension, HSV-1 rare outbreaks -primary care  manages meds and labs IBS C -upper abdominal discomfort/pressure-GI manages  Plan: Encouraged to follow-up with GI for persistent constipation/bloating/abdominal pressure.  Encouraged to continue increased water, flaxseed, increase fiber rich foods and supplements.  SBEs, annual screening mammogram, calcium rich foods, vitamin D 2000 IUs daily.  Reviewed importance of weightbearing and balance type exercise, fall prevention and home safety discussed.  Situational stress with work, reviewed importance of self-care, leisure activities.  Pap normal 2020, new screening guidelines reviewed.    Huel Cote [facility], 2:45 PM 08/27/2019

## 2019-08-27 NOTE — Patient Instructions (Signed)
Vit D 2000 iu daily  Health Maintenance After Age 70 After age 29, you are at a higher risk for certain long-term diseases and infections as well as injuries from falls. Falls are a major cause of broken bones and head injuries in people who are older than age 57. Getting regular preventive care can help to keep you healthy and well. Preventive care includes getting regular testing and making lifestyle changes as recommended by your health care provider. Talk with your health care provider about:  Which screenings and tests you should have. A screening is a test that checks for a disease when you have no symptoms.  A diet and exercise plan that is right for you. What should I know about screenings and tests to prevent falls? Screening and testing are the best ways to find a health problem early. Early diagnosis and treatment give you the best chance of managing medical conditions that are common after age 53. Certain conditions and lifestyle choices may make you more likely to have a fall. Your health care provider may recommend:  Regular vision checks. Poor vision and conditions such as cataracts can make you more likely to have a fall. If you wear glasses, make sure to get your prescription updated if your vision changes.  Medicine review. Work with your health care provider to regularly review all of the medicines you are taking, including over-the-counter medicines. Ask your health care provider about any side effects that may make you more likely to have a fall. Tell your health care provider if any medicines that you take make you feel dizzy or sleepy.  Osteoporosis screening. Osteoporosis is a condition that causes the bones to get weaker. This can make the bones weak and cause them to break more easily.  Blood pressure screening. Blood pressure changes and medicines to control blood pressure can make you feel dizzy.  Strength and balance checks. Your health care provider may recommend  certain tests to check your strength and balance while standing, walking, or changing positions.  Foot health exam. Foot pain and numbness, as well as not wearing proper footwear, can make you more likely to have a fall.  Depression screening. You may be more likely to have a fall if you have a fear of falling, feel emotionally low, or feel unable to do activities that you used to do.  Alcohol use screening. Using too much alcohol can affect your balance and may make you more likely to have a fall. What actions can I take to lower my risk of falls? General instructions  Talk with your health care provider about your risks for falling. Tell your health care provider if: ? You fall. Be sure to tell your health care provider about all falls, even ones that seem minor. ? You feel dizzy, sleepy, or off-balance.  Take over-the-counter and prescription medicines only as told by your health care provider. These include any supplements.  Eat a healthy diet and maintain a healthy weight. A healthy diet includes low-fat dairy products, low-fat (lean) meats, and fiber from whole grains, beans, and lots of fruits and vegetables. Home safety  Remove any tripping hazards, such as rugs, cords, and clutter.  Install safety equipment such as grab bars in bathrooms and safety rails on stairs.  Keep rooms and walkways well-lit. Activity   Follow a regular exercise program to stay fit. This will help you maintain your balance. Ask your health care provider what types of exercise are appropriate for you.  If you need a cane or walker, use it as recommended by your health care provider.  Wear supportive shoes that have nonskid soles. Lifestyle  Do not drink alcohol if your health care provider tells you not to drink.  If you drink alcohol, limit how much you have: ? 0-1 drink a day for women. ? 0-2 drinks a day for men.  Be aware of how much alcohol is in your drink. In the [place] one drink equals  one typical bottle of beer (12 oz), one-half glass of wine (5 oz), or one shot of hard liquor (1 oz).  Do not use any products that contain nicotine or tobacco, such as cigarettes and e-cigarettes. If you need help quitting, ask your health care provider. Summary  Having a healthy lifestyle and getting preventive care can help to protect your health and wellness after age 38.  Screening and testing are the best way to find a health problem early and help you avoid having a fall. Early diagnosis and treatment give you the best chance for managing medical conditions that are more common for people who are older than age 69.  Falls are a major cause of broken bones and head injuries in people who are older than age 49. Take precautions to prevent a fall at home.  Work with your health care provider to learn what changes you can make to improve your health and wellness and to prevent falls. This information is not intended to replace advice given to you by your health care provider. Make sure you discuss any questions you have with your health care provider. Document Revised: 09/12/2018 Document Reviewed: 04/04/2017 Elsevier Patient Education  2020 [employer].

## 2019-08-29 LAB — URINALYSIS, COMPLETE W/RFL CULTURE
Bacteria, UA: NONE SEEN /HPF
Bilirubin Urine: NEGATIVE
Glucose, UA: NEGATIVE
Hgb urine dipstick: NEGATIVE
Hyaline Cast: NONE SEEN /LPF
Ketones, ur: NEGATIVE
Leukocyte Esterase: NEGATIVE
Nitrites, Initial: NEGATIVE
Protein, ur: NEGATIVE
Specific Gravity, Urine: 1.017 (ref 1.001–1.03)
Squamous Epithelial / LPF: NONE SEEN /HPF (ref <=5)
WBC, UA: NONE SEEN /HPF (ref 0–5)
pH: >=8.5 — AB (ref 5.0–8.0)

## 2019-08-29 LAB — URINE CULTURE
MICRO NUMBER:: 10291364
Result:: NO GROWTH
SPECIMEN QUALITY:: ADEQUATE

## 2019-08-29 LAB — CULTURE INDICATED

## 2019-09-02 DIAGNOSIS — H524 Presbyopia: Secondary | ICD-10-CM | POA: Diagnosis not present

## 2019-09-02 DIAGNOSIS — H2513 Age-related nuclear cataract, bilateral: Secondary | ICD-10-CM | POA: Diagnosis not present

## 2019-09-02 DIAGNOSIS — H35341 Macular cyst, hole, or pseudohole, right eye: Secondary | ICD-10-CM | POA: Diagnosis not present

## 2019-11-19 ENCOUNTER — Emergency Department (HOSPITAL_COMMUNITY)
Admission: EM | Admit: 2019-11-19 | Discharge: 2019-11-19 | Disposition: A | Discharge status: Home / Self Care | Payer: Medicare HMO | Attending: Emergency Medicine | Admitting: Emergency Medicine

## 2019-11-19 ENCOUNTER — Emergency Department (HOSPITAL_COMMUNITY): Payer: Medicare HMO

## 2019-11-19 ENCOUNTER — Other Ambulatory Visit: Payer: Self-pay

## 2019-11-19 ENCOUNTER — Encounter (HOSPITAL_COMMUNITY): Payer: Self-pay | Admitting: *Deleted

## 2019-11-19 DIAGNOSIS — K5904 Chronic idiopathic constipation: Secondary | ICD-10-CM | POA: Diagnosis not present

## 2019-11-19 DIAGNOSIS — Z87891 Personal history of nicotine dependence: Secondary | ICD-10-CM | POA: Diagnosis not present

## 2019-11-19 DIAGNOSIS — R52 Pain, unspecified: Secondary | ICD-10-CM | POA: Diagnosis not present

## 2019-11-19 DIAGNOSIS — K5730 Diverticulosis of large intestine without perforation or abscess without bleeding: Secondary | ICD-10-CM | POA: Diagnosis not present

## 2019-11-19 DIAGNOSIS — R1011 Right upper quadrant pain: Secondary | ICD-10-CM | POA: Diagnosis not present

## 2019-11-19 DIAGNOSIS — R1084 Generalized abdominal pain: Secondary | ICD-10-CM | POA: Diagnosis not present

## 2019-11-19 DIAGNOSIS — R110 Nausea: Secondary | ICD-10-CM | POA: Diagnosis not present

## 2019-11-19 DIAGNOSIS — I10 Essential (primary) hypertension: Secondary | ICD-10-CM | POA: Diagnosis not present

## 2019-11-19 LAB — COMPREHENSIVE METABOLIC PANEL
ALT: 16 U/L (ref 0–44)
AST: 21 U/L (ref 15–41)
Albumin: 4 g/dL (ref 3.5–5.0)
Alkaline Phosphatase: 89 U/L (ref 38–126)
Anion gap: 11 (ref 5–15)
BUN: 16 mg/dL (ref 8–23)
CO2: 23 mmol/L (ref 22–32)
Calcium: 9.4 mg/dL (ref 8.9–10.3)
Chloride: 100 mmol/L (ref 98–111)
Creatinine, Ser: 0.51 mg/dL (ref 0.44–1.00)
GFR calc Af Amer: >60 mL/min (ref >60)
GFR calc non Af Amer: >60 mL/min (ref >60)
Glucose, Bld: 110 mg/dL — ABNORMAL HIGH (ref 70–99)
Potassium: 4.2 mmol/L (ref 3.5–5.1)
Sodium: 134 mmol/L — ABNORMAL LOW (ref 135–145)
Total Bilirubin: 0.6 mg/dL (ref 0.3–1.2)
Total Protein: 6.9 g/dL (ref 6.5–8.1)

## 2019-11-19 LAB — URINALYSIS, ROUTINE W REFLEX MICROSCOPIC
Bacteria, UA: NONE SEEN
Bilirubin Urine: NEGATIVE
Glucose, UA: NEGATIVE mg/dL
Hgb urine dipstick: NEGATIVE
Ketones, ur: NEGATIVE mg/dL
Nitrite: NEGATIVE
Protein, ur: NEGATIVE mg/dL
Specific Gravity, Urine: 1.012 (ref 1.005–1.030)
pH: 7 (ref 5.0–8.0)

## 2019-11-19 LAB — CBC
HCT: 39.7 % (ref 36.0–46.0)
Hemoglobin: 13.3 g/dL (ref 12.0–15.0)
MCH: 31.7 pg (ref 26.0–34.0)
MCHC: 33.5 g/dL (ref 30.0–36.0)
MCV: 94.7 fL (ref 80.0–100.0)
Platelets: 221 10*3/uL (ref 150–400)
RBC: 4.19 MIL/uL (ref 3.87–5.11)
RDW: 13.4 % (ref 11.5–15.5)
WBC: 8.5 10*3/uL (ref 4.0–10.5)
nRBC: 0 % (ref 0.0–0.2)

## 2019-11-19 LAB — LIPASE, BLOOD: Lipase: 47 U/L (ref 11–51)

## 2019-11-19 MED ORDER — ALUM & MAG HYDROXIDE-SIMETH 200-200-20 MG/5ML PO SUSP
30.0000 mL | Freq: Once | ORAL | Status: AC
  Administered 2019-11-19: 30 mL via ORAL
  Filled 2019-11-19: qty 30

## 2019-11-19 MED ORDER — SODIUM CHLORIDE 0.9% FLUSH: 3.0000 mL | Freq: Once | INTRAVENOUS | Status: DC

## 2019-11-19 MED ORDER — HYOSCYAMINE SULFATE 0.125 MG SL SUBL
0.2500 mg | SUBLINGUAL_TABLET | Freq: Once | SUBLINGUAL | Status: AC
  Administered 2019-11-19: 0.25 mg via SUBLINGUAL
  Filled 2019-11-19: qty 2

## 2019-11-19 MED ORDER — LIDOCAINE VISCOUS HCL 2 % MT SOLN
15.0000 mL | Freq: Once | OROMUCOSAL | Status: AC
  Administered 2019-11-19: 15 mL via ORAL
  Filled 2019-11-19: qty 15

## 2019-11-19 NOTE — ED Provider Notes (Signed)
[facility] DEPT Provider Note  CSN: [id number] Arrival date & time: 11/19/19 0304  Chief Complaint(s) Abdominal Pain  HPI Tracy [name] is a 69 y.o. female   The history is provided by the patient.  Abdominal Pain Pain location:  RLQ and R flank Pain quality: burning, sharp and stabbing   Pain radiates to:  Epigastric region Pain severity:  Severe Onset quality:  Sudden Duration:  4 hours Timing:  Constant Progression:  Waxing and waning Chronicity:  New Context: previous surgery (resection for diverticulitis)   Context: not sick contacts and not suspicious food intake   Relieved by:  Nothing Worsened by:  Palpation Ineffective treatments:  None tried Associated symptoms: flatus   Associated symptoms: no chest pain, no constipation, no cough, no diarrhea, no dysuria, no fatigue, no fever, no melena, no nausea, no shortness of breath and no vomiting     Past Medical History Past Medical History:  Diagnosis Date  . ASCUS favor benign 2000   negative pap smears since  . Constipation   . Diverticulitis   . HSV (herpes simplex virus) anogenital infection   . IBS (irritable bowel syndrome)   . Postmenopausal osteoporosis 12/2018   T score -2.5   Patient Active Problem List   Diagnosis Date Noted  . Osteoporosis without current pathological fracture 02/24/2019   Home Medication(s) Prior to Admission medications   Medication Sig Start Date End Date Taking? Authorizing Provider  Cholecalciferol (VITAMIN D PO) Take by mouth.    [provider]  Flaxseed, Linseed, (FLAX SEEDS PO) Take by mouth.    [provider]  linaCLOtide (LINZESS PO) Take by mouth.    [provider]  telmisartan (MICARDIS) 80 MG tablet Take 80 mg by mouth daily. 07/11/19   [provider]  valACYclovir (VALTREX) 500 MG tablet Take 1 tablet (500 mg total) by mouth 2 (two) times daily. For 5 days with outbreaks 08/15/19   Huel Cote,  NP                                                                                                                                    Past Surgical History Past Surgical History:  Procedure Laterality Date  . APPENDECTOMY    . COLON SURGERY  2012  . Uterine polyp     Family History Family History  Problem Relation Age of Onset  . Heart attack Father   . Lung disease Father   . Cancer Maternal Grandmother        Uterine  . Heart failure Sister     Social History Social History   Tobacco Use  . Smoking status: Former Research scientist (life sciences)  . Smokeless tobacco: Never Used  . Tobacco comment: QUIT 1974  Vaping Use  . Vaping Use: Never used  Substance Use Topics  . Alcohol use: Yes    Comment: Rare  . Drug use: No  Allergies Patient has no known allergies.  Review of Systems Review of Systems  Constitutional: Negative for fatigue and fever.  Respiratory: Negative for cough and shortness of breath.   Cardiovascular: Negative for chest pain.  Gastrointestinal: Positive for abdominal pain and flatus. Negative for constipation, diarrhea, melena, nausea and vomiting.  Genitourinary: Negative for dysuria.   All other systems are reviewed and are negative for acute change except as noted in the HPI  Physical Exam Vital Signs  I have reviewed the triage vital signs BP (!) 149/82 (BP Location: Right Arm)   Pulse 81   Temp 98.7 F (37.1 C) (Oral)   Resp 17   Ht 5\' 3"  (1.6 m)   Wt 66.1 kg   SpO2 99%   BMI 25.83 kg/m   Physical Exam Vitals reviewed.  Constitutional:      General: She is not in acute distress.    Appearance: She is well-developed. She is not diaphoretic.  HENT:     Head: Normocephalic and atraumatic.     Nose: Nose normal.  Eyes:     General: No scleral icterus.       Right eye: No discharge.        Left eye: No discharge.     Conjunctiva/sclera: Conjunctivae normal.     Pupils: Pupils are equal, round, and reactive to light.  Cardiovascular:     Rate and  Rhythm: Normal rate and regular rhythm.     Heart sounds: No murmur heard.  No friction rub. No gallop.   Pulmonary:     Effort: Pulmonary effort is normal. No respiratory distress.     Breath sounds: Normal breath sounds. No stridor. No rales.  Abdominal:     General: There is no distension.     Palpations: Abdomen is soft.     Tenderness: There is abdominal tenderness in the right upper quadrant and epigastric area. There is no guarding or rebound. Negative signs include Murphy's sign.  Musculoskeletal:        General: No tenderness.     Cervical back: Normal range of motion and neck supple.  Skin:    General: Skin is warm and dry.     Findings: No erythema or rash.  Neurological:     Mental Status: She is alert and oriented to person, place, and time.     ED Results and Treatments Labs (all labs ordered are listed, but only abnormal results are displayed) Labs Reviewed  COMPREHENSIVE METABOLIC PANEL - Abnormal; Notable for the following components:      Result Value   Sodium 134 (*)    Glucose, Bld 110 (*)    All other components within normal limits  URINALYSIS, ROUTINE W REFLEX MICROSCOPIC - Abnormal; Notable for the following components:   Leukocytes,Ua MODERATE (*)    All other components within normal limits  LIPASE, BLOOD  CBC  EKG  EKG Interpretation  Date/Time:    Ventricular Rate:    PR Interval:    QRS Duration:   QT Interval:    QTC Calculation:   R Axis:     Text Interpretation:        Radiology CT Renal Stone Study  Result Date: 11/19/2019 CLINICAL DATA:  Right lower quadrant abdominal pain EXAM: CT ABDOMEN AND PELVIS WITHOUT CONTRAST TECHNIQUE: Multidetector CT imaging of the abdomen and pelvis was performed following the standard protocol without IV contrast. COMPARISON:  June 11, 2017 FINDINGS: Lower chest: The visualized  heart size within normal limits. No pericardial fluid/thickening. No hiatal hernia. The visualized portions of the lungs are clear. Hepatobiliary: Although limited due to the lack of intravenous contrast, normal in appearance without gross focal abnormality. No evidence of calcified gallstones or biliary ductal dilatation. Pancreas:  Unremarkable.  No surrounding inflammatory changes. Spleen: Normal in size. Although limited due to the lack of intravenous contrast, normal in appearance. Adrenals/Urinary Tract: Both adrenal glands appear normal. The kidneys and collecting system appear normal without evidence of urinary tract calculus or hydronephrosis. Bladder is unremarkable. Stomach/Bowel: The stomach and small bowel are normal in appearance. There is a moderate to large amount of colonic stool present. Scattered colonic diverticula are noted without diverticulitis. The patient is status post appendectomy. Vascular/Lymphatic: There are no enlarged abdominal or pelvic lymph nodes. Scattered aortic atherosclerotic calcifications are seen without aneurysmal dilatation. Reproductive: The uterus and adnexa are unremarkable. Other: No evidence of abdominal wall mass or hernia. Musculoskeletal: No acute or significant osseous findings. IMPRESSION: No renal or collecting system calculi. Diverticulosis without diverticulitis. Moderate to large amount of colonic stool without evidence of obstruction. Aortic Atherosclerosis (ICD10-I70.0). Electronically Signed   By: Prudencio Pair M.D.   On: 11/19/2019 06:47    Pertinent labs & imaging results that were available during my care of the patient were reviewed by me and considered in my medical decision making (see chart for details).  Medications Ordered in ED Medications  sodium chloride flush (NS) 0.9 % injection 3 mL (has no administration in time range)  hyoscyamine (LEVSIN SL) SL tablet 0.25 mg (0.25 mg Sublingual Given 11/19/19 0624)  alum & mag hydroxide-simeth  (MAALOX/MYLANTA) 200-200-20 MG/5ML suspension 30 mL (30 mLs Oral Given 11/19/19 0626)    And  lidocaine (XYLOCAINE) 2 % viscous mouth solution 15 mL (15 mLs Oral Given 11/19/19 0254)                                                                                                                                    Procedures Procedures  (including critical care time)  Medical Decision Making / ED Course I have reviewed the nursing notes for this encounter and the patient's prior records (if available in EHR or on provided paperwork).   Tracy [name] was evaluated in Emergency Department on 11/19/2019 for the symptoms described in the history  of present illness. She was evaluated in the context of the global COVID-19 pandemic, which necessitated consideration that the patient might be at risk for infection with the SARS-CoV-2 virus that causes COVID-19. Institutional protocols and algorithms that pertain to the evaluation of patients at risk for COVID-19 are in a state of rapid change based on information released by regulatory bodies including the CDC and federal and state organizations. These policies and algorithms were followed during the patient's care in the ED.  Right sided and upper abd pain. Mild TTP w/o peritonitis Labs without leukocytosis or anemia.  No significant electrolyte derangements or renal sufficiency.  No evidence of bili obstruction or pancreatitis.  UA with leukocytes but no bacteria or nitrites.  Patient is not having urinary symptoms concerning for urinary tract infection.  No hematuria. Given sudden onset of pain, will obtain a CT scan to assess for possible stone.  On review of records, patient has had several right upper quadrants ultrasounds and CTs without gallstones. Doubt acute cholecystitis today.  CT reassuring, showing evidence of constipation.      Final Clinical Impression(s) / ED Diagnoses Final diagnoses:  Right upper quadrant abdominal pain  Chronic  idiopathic constipation    The patient appears reasonably screened and/or stabilized for discharge and I doubt any other medical condition or other [facility] requiring further screening, evaluation, or treatment in the ED at this time prior to discharge. Safe for discharge with strict return precautions.  Disposition: Discharge  Condition: Good  I have discussed the results, Dx and Tx plan with the patient/family who expressed understanding and agree(s) with the plan. Discharge instructions discussed at length. The patient/family was given strict return precautions who verbalized understanding of the instructions. No further questions at time of discharge.    ED Discharge Orders    None       Follow Up: Lavone Orn, MD [address] [place] [address] [phone]  Schedule an appointment as soon as possible for a visit  As needed     This chart was dictated using voice recognition software.  Despite best efforts to proofread,  errors can occur which can change the documentation meaning.   Fatima Blank, MD 11/19/19 [phone]

## 2019-11-19 NOTE — ED Triage Notes (Signed)
EMS reports RLQ pain, she has had appendectomy. Nausea during transport, none now. 178/110-110-18-98% RA CBG 119.

## 2019-11-24 DIAGNOSIS — R42 Dizziness and giddiness: Secondary | ICD-10-CM | POA: Diagnosis not present

## 2019-11-24 DIAGNOSIS — K5900 Constipation, unspecified: Secondary | ICD-10-CM | POA: Diagnosis not present

## 2019-11-24 DIAGNOSIS — R69 Illness, unspecified: Secondary | ICD-10-CM | POA: Diagnosis not present

## 2019-12-29 ENCOUNTER — Encounter: Payer: Self-pay | Admitting: Obstetrics and Gynecology

## 2019-12-29 DIAGNOSIS — Z1231 Encounter for screening mammogram for malignant neoplasm of breast: Secondary | ICD-10-CM | POA: Diagnosis not present

## 2020-01-08 DIAGNOSIS — K5900 Constipation, unspecified: Secondary | ICD-10-CM | POA: Diagnosis not present

## 2020-01-08 DIAGNOSIS — I10 Essential (primary) hypertension: Secondary | ICD-10-CM | POA: Diagnosis not present

## 2020-01-08 DIAGNOSIS — H9202 Otalgia, left ear: Secondary | ICD-10-CM | POA: Diagnosis not present

## 2020-02-17 DIAGNOSIS — K5730 Diverticulosis of large intestine without perforation or abscess without bleeding: Secondary | ICD-10-CM | POA: Diagnosis not present

## 2020-02-17 DIAGNOSIS — K5904 Chronic idiopathic constipation: Secondary | ICD-10-CM | POA: Diagnosis not present

## 2020-02-17 DIAGNOSIS — K644 Residual hemorrhoidal skin tags: Secondary | ICD-10-CM | POA: Diagnosis not present

## 2020-02-17 DIAGNOSIS — R140 Abdominal distension (gaseous): Secondary | ICD-10-CM | POA: Diagnosis not present

## 2020-02-24 ENCOUNTER — Ambulatory Visit: Payer: Medicare HMO | Admitting: Internal Medicine

## 2020-02-24 ENCOUNTER — Other Ambulatory Visit: Payer: Self-pay

## 2020-02-24 ENCOUNTER — Encounter: Payer: Self-pay | Admitting: Internal Medicine

## 2020-02-24 VITALS — BP 128/80 | HR 65 | Ht 62.25 in | Wt 146.0 lb

## 2020-02-24 DIAGNOSIS — E559 Vitamin D deficiency, unspecified: Secondary | ICD-10-CM | POA: Diagnosis not present

## 2020-02-24 DIAGNOSIS — M810 Age-related osteoporosis without current pathological fracture: Secondary | ICD-10-CM | POA: Diagnosis not present

## 2020-02-24 LAB — TSH: TSH: 2.22 u[IU]/mL (ref 0.35–4.50)

## 2020-02-24 NOTE — Progress Notes (Signed)
Patient ID: Tracy [name], female   DOB: [birth year], 69 y.o.   MRN: [id number]   This visit occurred during the SARS-CoV-2 public health emergency.  Safety protocols were in place, including screening questions prior to the visit, additional usage of staff PPE, and extensive cleaning of exam room while observing appropriate contact time as indicated for disinfecting solutions.   HPI  Tracy [name] is a 69 y.o.-year-old female, initially referred by Dr. Phineas Real, returning for follow-up for osteoporosis (OP).  Last visit a year ago.  Pt was dx with OP in 2020, previously osteopenia.  Reviewed previous DXA scan records: Date L1-L4 T score FN T score Total Hip T score   12/05/2018 ([facility])  -1.5 (-0.2%) RFN: -1.3 (+15.2%*) LFN: -2.1 (+0.7%) RTH: -2.0 (-0.8%) LTH: -2.5 (-2.5%)  08/15/2016 ([facility])  -1.5 RFN: -2.2 LFN: -2.2 RTH: -1.9 (+0.4%) LTH: -2.3 (-4.5%*)  07/27/2014 ([facility])  -1.4 RFN: -2.2 LFN: -2.2 RTH: -2.0 LTH: -2.1   No falls or fractures. She subluxated her R hip >> now hurts.   She denies dizziness, orthostasis, poor vision.  She had an episode of vertigo in 2013 - resolved. Sees Dr. Valetta Close - ophthalmology - "hole in the retina".  Previous osteoporosis treatments: -Fosamax (few years ago) >> GERD.  Of note, she has a history of jaw bone loss. She has bruxism - has night guard.  She previously broke 3 teeth. She is now using Invisalign.  She has a history of vitamin D insufficiency: Lab Results  Component Value Date   VD25OH 29.29 (L) 02/24/2019   Pt is on: - vitamin D 2000 units daily (increased at last visit)  + intermittent weightbearing exercises.  She walks/does yoga, but also not consistently.  She does not take high vitamin A doses.  Menopause was at 69 y/o.   + FH of osteoporosis -mother with pubic bone hairline fracture  No history of kidney stones, hyper or hypocalcemia or hyperparathyroidism: Lab Results  Component Value Date   CALCIUM 9.4 11/19/2019    CALCIUM 10.0 02/24/2019    No history of thyrotoxicosis. No results found for: TSH   No history of CKD: Lab Results  Component Value Date   BUN 16 11/19/2019   CREATININE 0.51 11/19/2019    She has osteoarthritis.  RA was ruled out by Dr. Estanislado Pandy.  She sees a Restaurant manager, fast food for low back pain.  She has a history of hypertension.  She also sees Dr. Collene Mares for constipation >> on Linzess. Tried many different remedies >>  Not effective.  This is very bothersome for her.  ROS: Constitutional: no weight gain/+ weight loss, + fatigue, no subjective hyperthermia, no subjective hypothermia Eyes: no blurry vision, no xerophthalmia ENT: no sore throat, no nodules palpated in neck, no dysphagia, no odynophagia, no hoarseness Cardiovascular: no CP/no SOB/no palpitations/no leg swelling Respiratory: no cough/no SOB/no wheezing Gastrointestinal: no N/no V/no D/+ C/+ acid reflux Musculoskeletal: no muscle aches/+ joint aches Skin: no rashes, no hair loss Neurological: no tremors/no numbness/no tingling/no dizziness  I reviewed pt's medications, allergies, PMH, social hx, family hx, and changes were documented in the history of present illness. Otherwise, unchanged from my initial visit note.  Past Medical History:  Diagnosis Date  . ASCUS favor benign 2000   negative pap smears since  . Constipation   . Diverticulitis   . HSV (herpes simplex virus) anogenital infection   . IBS (irritable bowel syndrome)   . Postmenopausal osteoporosis 12/2018   T score -2.5  Past Surgical History:  Procedure Laterality Date  . APPENDECTOMY    . COLON SURGERY  2012  . Uterine polyp     Social History   Socioeconomic History  . Marital status: Single    Spouse name: Not on file  . Number of children: 1 - son 8 y/o in 02/2019 -she is soon to become a grandmother  . Years of education: Not on file  . Highest education level: Not on file  Occupational History  . Not on file  Social Needs  .  Financial resource strain: Not on file  . Food insecurity    Worry: Not on file    Inability: Not on file  . Transportation needs    Medical: Not on file    Non-medical: Not on file  Tobacco Use  . Smoking status: Former Research scientist (life sciences), quit in 1975  . Smokeless tobacco: Never Used  . Tobacco comment: QUIT 1974  Substance and Sexual Activity  . Alcohol use: Yes- - 2 glasses 4-7 times a week      . Drug use: No   Current Outpatient Medications on File Prior to Visit  Medication Sig Dispense Refill  . Cholecalciferol (VITAMIN D PO) Take by mouth.    . Flaxseed, Linseed, (FLAX SEEDS PO) Take by mouth.    . linaCLOtide (LINZESS PO) Take by mouth.    . telmisartan (MICARDIS) 80 MG tablet Take 80 mg by mouth daily.    . valACYclovir (VALTREX) 500 MG tablet Take 1 tablet (500 mg total) by mouth 2 (two) times daily. For 5 days with outbreaks 30 tablet 0   No current facility-administered medications on file prior to visit.   No Known Allergies Family History  Problem Relation Age of Onset  . Heart attack Father   . Lung disease Father   . Cancer Maternal Grandmother        Uterine  . Heart failure Sister   Mother died of perforated bowel at 73. Father died at 47.  PE: BP 128/80   Pulse 65   Ht 5' 2.25" (1.581 m)   Wt 146 lb (66.2 kg)   SpO2 98%   BMI 26.49 kg/m  Wt Readings from Last 3 Encounters:  02/24/20 146 lb (66.2 kg)  11/19/19 145 lb 12.8 oz (66.1 kg)  08/27/19 151 lb (68.5 kg)   Constitutional: normal weight, in NAD, no kyphosis Eyes: PERRLA, EOMI, no exophthalmos ENT: moist mucous membranes, no thyromegaly, no cervical lymphadenopathy Cardiovascular: RRR, No MRG Respiratory: CTA B Gastrointestinal: abdomen soft, NT, ND, BS+ Musculoskeletal: no deformities, strength intact in all 4 Skin: moist, warm, no rashes Neurological: no tremor with outstretched hands, DTR normal in all 4  Assessment: 1. Osteoporosis  2.  Vitamin D insufficiency  Plan: 1.  Osteoporosis -Likely postmenopausal/age-related, and she also has family history of osteoporosis (mother with pubic fracture) -We discussed about increased risk for fracture, depending on the T score, greatly increased on the T score is lower than -2.5 but I explained that this is actually a continuum and -2.5 should not be regarded as an absolute threshold.  I reviewed her latest DEXA scan report and it appears that she is at high risk for fractures, however, on the last BMD report, her T-scores at the level of the right femoral neck are discrepant with the right total hip.  This is an unusual finding, however, overall, her bone density scans were stable. -We discussed at last visit about making sure that she gets 1000-1200 mg  of calcium preferentially from the diet. -At last visit, her vitamin D level was slightly low so we increased her supplement -We discussed about fall precautions -Recommended weightbearing exercises.  She is doing some, but not consistently. -She is not smoking or drinking more than 2 alcoholic drinks a day -She is trying to follow an alkaline diet - she lost ~25 lbs since last OV - changed diet - Weight Watchers -At last visit we discussed about different medication classes, benefits, and side effects.  However, we decided to optimize her vitamin D, diet, exercise first, since her last bone density was different and I felt that this was not completely reliable.  We did discuss that 2/2 her history of bone loss in the right side of her neck, we need to be careful with ONJ, but this usually happens after several years of treatment and mostly patients that use higher doses of medications, more frequently (cancer patients). -At today's visit, we reviewed her recent vitamin D and BMP from 11/2019. -We will check another bone density scan in 12/2020.  At that time, we will decide whether we need to start medication or not. -I will see her back in ~1 year  2.  Vitamin D  insufficiency -At last visit, her vitamin D level returned slightly low, at 29.29.  We increased her vitamin D supplement from 1000 to 2000 units daily -She did not return for repeat vitamin D level-we will check this today.  Component     Latest Ref Rng & Units 02/24/2020  TSH     0.35 - 4.50 uIU/mL 2.22  Vitamin D, 25-Hydroxy     30.0 - 100.0 ng/mL 31.7  Normal labs.  Philemon Kingdom, MD PhD [facility] Endocr[facility]

## 2020-02-24 NOTE — Patient Instructions (Signed)
Please stop at the lab.  Please continue 2000 units vitamin D daily.  Please come back for a follow-up appointment in 1 year.

## 2020-02-25 DIAGNOSIS — R69 Illness, unspecified: Secondary | ICD-10-CM | POA: Diagnosis not present

## 2020-02-25 LAB — VITAMIN D 25 HYDROXY (VIT D DEFICIENCY, FRACTURES): Vit D, 25-Hydroxy: 31.7 ng/mL (ref 30.0–100.0)

## 2020-05-18 ENCOUNTER — Ambulatory Visit
Admission: RE | Admit: 2020-05-18 | Discharge: 2020-05-18 | Disposition: A | Discharge status: Home / Self Care | Payer: Medicare HMO | Source: Ambulatory Visit | Attending: Physician Assistant | Admitting: Physician Assistant

## 2020-05-18 ENCOUNTER — Other Ambulatory Visit: Payer: Self-pay | Admitting: Physician Assistant

## 2020-05-18 DIAGNOSIS — M5412 Radiculopathy, cervical region: Secondary | ICD-10-CM

## 2020-05-18 DIAGNOSIS — M2578 Osteophyte, vertebrae: Secondary | ICD-10-CM | POA: Diagnosis not present

## 2020-05-18 DIAGNOSIS — M4803 Spinal stenosis, cervicothoracic region: Secondary | ICD-10-CM | POA: Diagnosis not present

## 2020-05-18 DIAGNOSIS — M4312 Spondylolisthesis, cervical region: Secondary | ICD-10-CM | POA: Diagnosis not present

## 2020-05-18 DIAGNOSIS — M47812 Spondylosis without myelopathy or radiculopathy, cervical region: Secondary | ICD-10-CM | POA: Diagnosis not present

## 2020-05-20 DIAGNOSIS — M531 Cervicobrachial syndrome: Secondary | ICD-10-CM | POA: Diagnosis not present

## 2020-05-20 DIAGNOSIS — M5414 Radiculopathy, thoracic region: Secondary | ICD-10-CM | POA: Diagnosis not present

## 2020-05-20 DIAGNOSIS — M9901 Segmental and somatic dysfunction of cervical region: Secondary | ICD-10-CM | POA: Diagnosis not present

## 2020-05-20 DIAGNOSIS — M9902 Segmental and somatic dysfunction of thoracic region: Secondary | ICD-10-CM | POA: Diagnosis not present

## 2020-05-24 DIAGNOSIS — M5414 Radiculopathy, thoracic region: Secondary | ICD-10-CM | POA: Diagnosis not present

## 2020-05-24 DIAGNOSIS — M531 Cervicobrachial syndrome: Secondary | ICD-10-CM | POA: Diagnosis not present

## 2020-05-24 DIAGNOSIS — M9901 Segmental and somatic dysfunction of cervical region: Secondary | ICD-10-CM | POA: Diagnosis not present

## 2020-05-24 DIAGNOSIS — M9902 Segmental and somatic dysfunction of thoracic region: Secondary | ICD-10-CM | POA: Diagnosis not present

## 2020-05-27 DIAGNOSIS — M531 Cervicobrachial syndrome: Secondary | ICD-10-CM | POA: Diagnosis not present

## 2020-05-27 DIAGNOSIS — M9901 Segmental and somatic dysfunction of cervical region: Secondary | ICD-10-CM | POA: Diagnosis not present

## 2020-05-27 DIAGNOSIS — M9902 Segmental and somatic dysfunction of thoracic region: Secondary | ICD-10-CM | POA: Diagnosis not present

## 2020-05-27 DIAGNOSIS — M5414 Radiculopathy, thoracic region: Secondary | ICD-10-CM | POA: Diagnosis not present

## 2020-05-31 DIAGNOSIS — M531 Cervicobrachial syndrome: Secondary | ICD-10-CM | POA: Diagnosis not present

## 2020-05-31 DIAGNOSIS — M9901 Segmental and somatic dysfunction of cervical region: Secondary | ICD-10-CM | POA: Diagnosis not present

## 2020-05-31 DIAGNOSIS — M5414 Radiculopathy, thoracic region: Secondary | ICD-10-CM | POA: Diagnosis not present

## 2020-05-31 DIAGNOSIS — M9902 Segmental and somatic dysfunction of thoracic region: Secondary | ICD-10-CM | POA: Diagnosis not present

## 2020-06-03 DIAGNOSIS — M5414 Radiculopathy, thoracic region: Secondary | ICD-10-CM | POA: Diagnosis not present

## 2020-06-03 DIAGNOSIS — M531 Cervicobrachial syndrome: Secondary | ICD-10-CM | POA: Diagnosis not present

## 2020-06-03 DIAGNOSIS — M9901 Segmental and somatic dysfunction of cervical region: Secondary | ICD-10-CM | POA: Diagnosis not present

## 2020-06-03 DIAGNOSIS — M9902 Segmental and somatic dysfunction of thoracic region: Secondary | ICD-10-CM | POA: Diagnosis not present

## 2020-06-10 DIAGNOSIS — M531 Cervicobrachial syndrome: Secondary | ICD-10-CM | POA: Diagnosis not present

## 2020-06-10 DIAGNOSIS — M5414 Radiculopathy, thoracic region: Secondary | ICD-10-CM | POA: Diagnosis not present

## 2020-06-10 DIAGNOSIS — M9902 Segmental and somatic dysfunction of thoracic region: Secondary | ICD-10-CM | POA: Diagnosis not present

## 2020-06-10 DIAGNOSIS — M9901 Segmental and somatic dysfunction of cervical region: Secondary | ICD-10-CM | POA: Diagnosis not present

## 2020-06-14 DIAGNOSIS — M5414 Radiculopathy, thoracic region: Secondary | ICD-10-CM | POA: Diagnosis not present

## 2020-06-14 DIAGNOSIS — M9901 Segmental and somatic dysfunction of cervical region: Secondary | ICD-10-CM | POA: Diagnosis not present

## 2020-06-14 DIAGNOSIS — M531 Cervicobrachial syndrome: Secondary | ICD-10-CM | POA: Diagnosis not present

## 2020-06-14 DIAGNOSIS — M9902 Segmental and somatic dysfunction of thoracic region: Secondary | ICD-10-CM | POA: Diagnosis not present

## 2020-06-17 DIAGNOSIS — M9902 Segmental and somatic dysfunction of thoracic region: Secondary | ICD-10-CM | POA: Diagnosis not present

## 2020-06-17 DIAGNOSIS — M5414 Radiculopathy, thoracic region: Secondary | ICD-10-CM | POA: Diagnosis not present

## 2020-06-17 DIAGNOSIS — M9901 Segmental and somatic dysfunction of cervical region: Secondary | ICD-10-CM | POA: Diagnosis not present

## 2020-06-17 DIAGNOSIS — M531 Cervicobrachial syndrome: Secondary | ICD-10-CM | POA: Diagnosis not present

## 2020-06-24 DIAGNOSIS — M9902 Segmental and somatic dysfunction of thoracic region: Secondary | ICD-10-CM | POA: Diagnosis not present

## 2020-06-24 DIAGNOSIS — M5414 Radiculopathy, thoracic region: Secondary | ICD-10-CM | POA: Diagnosis not present

## 2020-06-24 DIAGNOSIS — M9901 Segmental and somatic dysfunction of cervical region: Secondary | ICD-10-CM | POA: Diagnosis not present

## 2020-06-24 DIAGNOSIS — M531 Cervicobrachial syndrome: Secondary | ICD-10-CM | POA: Diagnosis not present

## 2020-06-28 DIAGNOSIS — M9902 Segmental and somatic dysfunction of thoracic region: Secondary | ICD-10-CM | POA: Diagnosis not present

## 2020-06-28 DIAGNOSIS — M9901 Segmental and somatic dysfunction of cervical region: Secondary | ICD-10-CM | POA: Diagnosis not present

## 2020-06-28 DIAGNOSIS — M531 Cervicobrachial syndrome: Secondary | ICD-10-CM | POA: Diagnosis not present

## 2020-06-28 DIAGNOSIS — M5414 Radiculopathy, thoracic region: Secondary | ICD-10-CM | POA: Diagnosis not present

## 2020-07-01 DIAGNOSIS — M5414 Radiculopathy, thoracic region: Secondary | ICD-10-CM | POA: Diagnosis not present

## 2020-07-01 DIAGNOSIS — M9901 Segmental and somatic dysfunction of cervical region: Secondary | ICD-10-CM | POA: Diagnosis not present

## 2020-07-01 DIAGNOSIS — M531 Cervicobrachial syndrome: Secondary | ICD-10-CM | POA: Diagnosis not present

## 2020-07-01 DIAGNOSIS — M9902 Segmental and somatic dysfunction of thoracic region: Secondary | ICD-10-CM | POA: Diagnosis not present

## 2020-07-05 DIAGNOSIS — M5414 Radiculopathy, thoracic region: Secondary | ICD-10-CM | POA: Diagnosis not present

## 2020-07-05 DIAGNOSIS — M9901 Segmental and somatic dysfunction of cervical region: Secondary | ICD-10-CM | POA: Diagnosis not present

## 2020-07-05 DIAGNOSIS — M531 Cervicobrachial syndrome: Secondary | ICD-10-CM | POA: Diagnosis not present

## 2020-07-05 DIAGNOSIS — M9902 Segmental and somatic dysfunction of thoracic region: Secondary | ICD-10-CM | POA: Diagnosis not present

## 2020-07-08 DIAGNOSIS — Z1389 Encounter for screening for other disorder: Secondary | ICD-10-CM | POA: Diagnosis not present

## 2020-07-08 DIAGNOSIS — R1011 Right upper quadrant pain: Secondary | ICD-10-CM | POA: Diagnosis not present

## 2020-07-08 DIAGNOSIS — I10 Essential (primary) hypertension: Secondary | ICD-10-CM | POA: Diagnosis not present

## 2020-07-08 DIAGNOSIS — R739 Hyperglycemia, unspecified: Secondary | ICD-10-CM | POA: Diagnosis not present

## 2020-07-08 DIAGNOSIS — L989 Disorder of the skin and subcutaneous tissue, unspecified: Secondary | ICD-10-CM | POA: Diagnosis not present

## 2020-07-08 DIAGNOSIS — Z0000 Encounter for general adult medical examination without abnormal findings: Secondary | ICD-10-CM | POA: Diagnosis not present

## 2020-07-08 DIAGNOSIS — M47812 Spondylosis without myelopathy or radiculopathy, cervical region: Secondary | ICD-10-CM | POA: Diagnosis not present

## 2020-07-08 DIAGNOSIS — Z136 Encounter for screening for cardiovascular disorders: Secondary | ICD-10-CM | POA: Diagnosis not present

## 2020-07-08 DIAGNOSIS — M810 Age-related osteoporosis without current pathological fracture: Secondary | ICD-10-CM | POA: Diagnosis not present

## 2020-07-25 DIAGNOSIS — M545 Low back pain, unspecified: Secondary | ICD-10-CM | POA: Diagnosis not present

## 2020-07-26 DIAGNOSIS — M9901 Segmental and somatic dysfunction of cervical region: Secondary | ICD-10-CM | POA: Diagnosis not present

## 2020-07-26 DIAGNOSIS — M9902 Segmental and somatic dysfunction of thoracic region: Secondary | ICD-10-CM | POA: Diagnosis not present

## 2020-07-26 DIAGNOSIS — M531 Cervicobrachial syndrome: Secondary | ICD-10-CM | POA: Diagnosis not present

## 2020-07-26 DIAGNOSIS — M5414 Radiculopathy, thoracic region: Secondary | ICD-10-CM | POA: Diagnosis not present

## 2020-08-04 DIAGNOSIS — M542 Cervicalgia: Secondary | ICD-10-CM | POA: Diagnosis not present

## 2020-08-14 DIAGNOSIS — M542 Cervicalgia: Secondary | ICD-10-CM | POA: Diagnosis not present

## 2020-08-19 DIAGNOSIS — K5730 Diverticulosis of large intestine without perforation or abscess without bleeding: Secondary | ICD-10-CM | POA: Diagnosis not present

## 2020-08-19 DIAGNOSIS — K581 Irritable bowel syndrome with constipation: Secondary | ICD-10-CM | POA: Diagnosis not present

## 2020-08-19 DIAGNOSIS — R1033 Periumbilical pain: Secondary | ICD-10-CM | POA: Diagnosis not present

## 2020-08-19 DIAGNOSIS — R140 Abdominal distension (gaseous): Secondary | ICD-10-CM | POA: Diagnosis not present

## 2020-08-27 DIAGNOSIS — M542 Cervicalgia: Secondary | ICD-10-CM | POA: Diagnosis not present

## 2020-08-30 DIAGNOSIS — M542 Cervicalgia: Secondary | ICD-10-CM | POA: Diagnosis not present

## 2020-09-03 DIAGNOSIS — H04123 Dry eye syndrome of bilateral lacrimal glands: Secondary | ICD-10-CM | POA: Diagnosis not present

## 2020-09-03 DIAGNOSIS — H2513 Age-related nuclear cataract, bilateral: Secondary | ICD-10-CM | POA: Diagnosis not present

## 2020-09-03 DIAGNOSIS — H5203 Hypermetropia, bilateral: Secondary | ICD-10-CM | POA: Diagnosis not present

## 2020-09-07 ENCOUNTER — Other Ambulatory Visit: Payer: Self-pay

## 2020-09-07 ENCOUNTER — Encounter: Payer: Self-pay | Admitting: Nurse Practitioner

## 2020-09-07 ENCOUNTER — Ambulatory Visit (INDEPENDENT_AMBULATORY_CARE_PROVIDER_SITE_OTHER): Payer: Medicare HMO | Admitting: Nurse Practitioner

## 2020-09-07 ENCOUNTER — Telehealth: Payer: Self-pay | Admitting: *Deleted

## 2020-09-07 VITALS — BP 138/78 | Ht 63.0 in | Wt 154.0 lb

## 2020-09-07 DIAGNOSIS — N8110 Cystocele, unspecified: Secondary | ICD-10-CM

## 2020-09-07 DIAGNOSIS — Z01419 Encounter for gynecological examination (general) (routine) without abnormal findings: Secondary | ICD-10-CM | POA: Diagnosis not present

## 2020-09-07 DIAGNOSIS — N8189 Other female genital prolapse: Secondary | ICD-10-CM

## 2020-09-07 DIAGNOSIS — K5909 Other constipation: Secondary | ICD-10-CM

## 2020-09-07 DIAGNOSIS — M810 Age-related osteoporosis without current pathological fracture: Secondary | ICD-10-CM

## 2020-09-07 DIAGNOSIS — R39198 Other difficulties with micturition: Secondary | ICD-10-CM

## 2020-09-07 NOTE — Patient Instructions (Addendum)
Health Maintenance After Age 71 After age 71, you are at a higher risk for certain long-term diseases and infections as well as injuries from falls. Falls are a major cause of broken bones and head injuries in people who are older than age 71. Getting regular preventive care can help to keep you healthy and well. Preventive care includes getting regular testing and making lifestyle changes as recommended by your health care provider. Talk with your health care provider about:  Which screenings and tests you should have. A screening is a test that checks for a disease when you have no symptoms.  A diet and exercise plan that is right for you. What should I know about screenings and tests to prevent falls? Screening and testing are the best ways to find a health problem early. Early diagnosis and treatment give you the best chance of managing medical conditions that are common after age 71. Certain conditions and lifestyle choices may make you more likely to have a fall. Your health care provider may recommend:  Regular vision checks. Poor vision and conditions such as cataracts can make you more likely to have a fall. If you wear glasses, make sure to get your prescription updated if your vision changes.  Medicine review. Work with your health care provider to regularly review all of the medicines you are taking, including over-the-counter medicines. Ask your health care provider about any side effects that may make you more likely to have a fall. Tell your health care provider if any medicines that you take make you feel dizzy or sleepy.  Osteoporosis screening. Osteoporosis is a condition that causes the bones to get weaker. This can make the bones weak and cause them to break more easily.  Blood pressure screening. Blood pressure changes and medicines to control blood pressure can make you feel dizzy.  Strength and balance checks. Your health care provider may recommend certain tests to check your  strength and balance while standing, walking, or changing positions.  Foot health exam. Foot pain and numbness, as well as not wearing proper footwear, can make you more likely to have a fall.  Depression screening. You may be more likely to have a fall if you have a fear of falling, feel emotionally low, or feel unable to do activities that you used to do.  Alcohol use screening. Using too much alcohol can affect your balance and may make you more likely to have a fall. What actions can I take to lower my risk of falls? General instructions  Talk with your health care provider about your risks for falling. Tell your health care provider if: ? You fall. Be sure to tell your health care provider about all falls, even ones that seem minor. ? You feel dizzy, sleepy, or off-balance.  Take over-the-counter and prescription medicines only as told by your health care provider. These include any supplements.  Eat a healthy diet and maintain a healthy weight. A healthy diet includes low-fat dairy products, low-fat (lean) meats, and fiber from whole grains, beans, and lots of fruits and vegetables. Home safety  Remove any tripping hazards, such as rugs, cords, and clutter.  Install safety equipment such as grab bars in bathrooms and safety rails on stairs.  Keep rooms and walkways well-lit. Activity  Follow a regular exercise program to stay fit. This will help you maintain your balance. Ask your health care provider what types of exercise are appropriate for you.  If you need a cane or walker,   use it as recommended by your health care provider.  Wear supportive shoes that have nonskid soles.   Lifestyle  Do not drink alcohol if your health care provider tells you not to drink.  If you drink alcohol, limit how much you have: ? 0-1 drink a day for women. ? 0-2 drinks a day for men.  Be aware of how much alcohol is in your drink. In the [place] one drink equals one typical bottle of beer (12  oz), one-half glass of wine (5 oz), or one shot of hard liquor (1 oz).  Do not use any products that contain nicotine or tobacco, such as cigarettes and e-cigarettes. If you need help quitting, ask your health care provider. Summary  Having a healthy lifestyle and getting preventive care can help to protect your health and wellness after age 71.  Screening and testing are the best way to find a health problem early and help you avoid having a fall. Early diagnosis and treatment give you the best chance for managing medical conditions that are more common for people who are older than age 71.  Falls are a major cause of broken bones and head injuries in people who are older than age 71. Take precautions to prevent a fall at home.  Work with your health care provider to learn what changes you can make to improve your health and wellness and to prevent falls. This information is not intended to replace advice given to you by your health care provider. Make sure you discuss any questions you have with your health care provider. Document Revised: 09/12/2018 Document Reviewed: 04/04/2017 Elsevier Patient Education  2021 Elsevier Inc.  

## 2020-09-07 NOTE — Telephone Encounter (Signed)
-----   Message from Tamela Gammon, NP sent at 09/07/2020  8:37 AM EDT ----- Please send referral for Pelvic Floor PT for pelvic floor weakness, mild cystocele, constipation, and difficulty urinating.

## 2020-09-07 NOTE — Telephone Encounter (Signed)
Referral placed at [facility] for PT they will call to schedule.

## 2020-09-07 NOTE — Progress Notes (Signed)
   Tracy [name] [birth year] [id number]   History:  70 y.o. [id number] presents for breast and pelvic exam. She complains of feeling like she does not fully empty her bladder and has to void twice. Denies incontinence, frequency, and urgency. 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 History of IBC-C managed by GI. Postmenopausal - no HRT, no bleeding. Normal pap and mammogram history. Osteoporosis managed by endocrinology.   Gynecologic History No LMP recorded. Patient is postmenopausal.   Contraception: post menopausal status  Health Maintenance Last Pap: No longer screening per guidelines Last mammogram: 12/2019. Results were: normal Last colonoscopy: 2018 Last Dexa: 12/2018. Results were: T-score -2.5  Past medical history, past surgical history, family history and social history were all reviewed and documented in the EPIC chart.  ROS:  A ROS was performed and pertinent positives and negatives are included.  Exam:  Vitals:   09/07/20 0803  BP: 138/78  Weight: 154 lb (69.9 kg)  Height: 5\' 3"  (1.6 m)   Body mass index is 27.28 kg/m.  General appearance:  Normal Thyroid:  Symmetrical, normal in size, without palpable masses or nodularity. Respiratory  Auscultation:  Clear without wheezing or rhonchi Cardiovascular  Auscultation:  Regular rate, without rubs, murmurs or gallops  Edema/varicosities:  Not grossly evident Abdominal  Soft,nontender, without masses, guarding or rebound.  Liver/spleen:  No organomegaly noted  Hernia:  None appreciated  Skin  Inspection:  Grossly normal   Breasts: Examined lying and sitting.   Right: Without masses, retractions, discharge or axillary adenopathy.   Left: Without masses, retractions, discharge or axillary adenopathy. Gentitourinary   Inguinal/mons:  Normal without inguinal adenopathy  External genitalia:  Normal  BUS/Urethra/Skene's glands:  Normal  Vagina:  Mild cystocele, atrophy present  Cervix:   Normal  Uterus:  Normal in size, shape and contour.  Midline and mobile  Adnexa/parametria:     Rt: Without masses or tenderness.   Lt: Without masses or tenderness.  Anus and perineum: Normal  Digital rectal exam: Normal sphincter tone without palpated masses or tenderness  Assessment/Plan:  70 y.o. [id number] for breast and pelvic exam.   Well female exam with routine gynecological exam - Education provided on SBEs, importance of preventative screenings, current guidelines, high calcium diet, regular exercise, and multivitamin daily. Labs done elsewhere.   Female cystocele - She complains of feeling like she does not fully empty her bladder and has to void twice. Denies incontinence, frequency, and urgency. 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  History of IBC-C managed by GI. We discussed how bladder prolapse, weight gain, changes in activity with desk job, and constipation can all contribute to bladder symptoms and the management options available. She would like a referral to pelvic floor PT.   Postmenopausal osteoporosis - T-score 12/2018 was -2.5. Managed by endocrinology. Taking daily vitamin D supplement and exercising regularly.   Screening for cervical cancer - Normal Pap history.  No longer screening per guidelines.  Screening for breast cancer - Normal mammogram history.  Continue annual screenings.  Normal breast exam today.  Screening for colon cancer - 2018 colonoscopy. Will repeat at GI's recommended interval.   Return in 1 year for annual.       Tamela Gammon DNP, 8:41 AM 09/07/2020

## 2020-09-13 DIAGNOSIS — M542 Cervicalgia: Secondary | ICD-10-CM | POA: Diagnosis not present

## 2020-09-15 NOTE — Telephone Encounter (Signed)
Patient scheduled on 11/08/20

## 2020-09-24 DIAGNOSIS — L821 Other seborrheic keratosis: Secondary | ICD-10-CM | POA: Diagnosis not present

## 2020-09-24 DIAGNOSIS — L570 Actinic keratosis: Secondary | ICD-10-CM | POA: Diagnosis not present

## 2020-09-24 DIAGNOSIS — D1801 Hemangioma of skin and subcutaneous tissue: Secondary | ICD-10-CM | POA: Diagnosis not present

## 2020-09-27 DIAGNOSIS — M542 Cervicalgia: Secondary | ICD-10-CM | POA: Diagnosis not present

## 2020-09-30 ENCOUNTER — Other Ambulatory Visit (HOSPITAL_BASED_OUTPATIENT_CLINIC_OR_DEPARTMENT_OTHER): Payer: Self-pay

## 2020-09-30 ENCOUNTER — Other Ambulatory Visit: Payer: Self-pay

## 2020-09-30 ENCOUNTER — Other Ambulatory Visit (HOSPITAL_COMMUNITY): Payer: Self-pay

## 2020-09-30 ENCOUNTER — Ambulatory Visit: Payer: Medicare HMO | Attending: Internal Medicine

## 2020-09-30 DIAGNOSIS — Z23 Encounter for immunization: Secondary | ICD-10-CM

## 2020-09-30 MED ORDER — PFIZER-BIONT COVID-19 VAC-TRIS 30 MCG/0.3ML IM SUSP
INTRAMUSCULAR | 0 refills | Status: DC
  Filled 2020-09-30: qty 0.3, 1d supply, fill #0

## 2020-09-30 NOTE — Progress Notes (Signed)
   Covid-19 Vaccination Clinic  Name:  Tracy [name]    MRN: [id number] DOB: 1950-06-18  09/30/2020  Tracy [name] was observed post Covid-19 immunization for 15 minutes without incident. She was provided with Vaccine Information Sheet and instruction to access the V-Safe system.   Tracy [name] was instructed to call 911 with any severe reactions post vaccine: Marland Kitchen Difficulty breathing  . Swelling of face and throat  . A fast heartbeat  . A bad rash all over body  . Dizziness and weakness   Immunizations Administered    Name Date Dose VIS Date Route   PFIZER Comrnaty(Gray TOP) Covid-19 Vaccine 09/30/2020  9:52 AM 0.3 mL 05/13/2020 Intramuscular   Manufacturer: Jenison   Lot: [id number]   NDC: [phone]

## 2020-10-07 DIAGNOSIS — K5730 Diverticulosis of large intestine without perforation or abscess without bleeding: Secondary | ICD-10-CM | POA: Diagnosis not present

## 2020-10-07 DIAGNOSIS — R194 Change in bowel habit: Secondary | ICD-10-CM | POA: Diagnosis not present

## 2020-10-07 DIAGNOSIS — R197 Diarrhea, unspecified: Secondary | ICD-10-CM | POA: Diagnosis not present

## 2020-10-13 DIAGNOSIS — M542 Cervicalgia: Secondary | ICD-10-CM | POA: Diagnosis not present

## 2020-11-03 DIAGNOSIS — M542 Cervicalgia: Secondary | ICD-10-CM | POA: Diagnosis not present

## 2020-11-08 ENCOUNTER — Other Ambulatory Visit: Payer: Self-pay

## 2020-11-08 ENCOUNTER — Ambulatory Visit: Payer: Medicare HMO | Attending: Nurse Practitioner | Admitting: Physical Therapy

## 2020-11-08 DIAGNOSIS — R252 Cramp and spasm: Secondary | ICD-10-CM

## 2020-11-08 DIAGNOSIS — R279 Unspecified lack of coordination: Secondary | ICD-10-CM | POA: Insufficient documentation

## 2020-11-08 NOTE — Therapy (Signed)
[facility] [address] [place], [place], [address] Phone: [phone]   Fax:  [phone]  Physical Therapy Evaluation  Patient Details  Name: Tracy [name] MRN: [id number] Date of Birth: January 18, 1951 Referring Provider (PT): Tamela Gammon, NP   Encounter Date: 11/08/2020   PT End of Session - 11/08/20 1013    Visit Number 1    Date for PT Re-Evaluation 01/31/21    Authorization Type Aetna    Authorization - Visit Number --    PT Start Time 1013    PT Stop Time 1100    PT Time Calculation (min) 47 min    Activity Tolerance Patient tolerated treatment well    Behavior During Therapy [facility] for tasks assessed/performed           Past Medical History:  Diagnosis Date  . ASCUS favor benign 2000   negative pap smears since  . Constipation   . Diverticulitis   . HSV (herpes simplex virus) anogenital infection   . IBS (irritable bowel syndrome)   . Postmenopausal osteoporosis 12/2018   T score -2.5    Past Surgical History:  Procedure Laterality Date  . APPENDECTOMY    . COLON SURGERY  2012  . Uterine polyp      There were no vitals filed for this visit.    Subjective Assessment - 11/08/20 1017    Subjective I have been constipated every since colon resection 2013 and now has IBS-C.  Pt currently has  daily BM following mirilax. Taking probiotic, stool softener, drinking a lot of water (44oz is a lot for her). Urinary leakage sometimes after emptying bladder.  One light pad every day.  Pt has about 1 or no complete BMs per week.  Pt had BM every morning normally before all of this started.    Pertinent History hx of 4th degree tear and 2 previous miscarriages D&C    Patient Stated Goals constipation to be improved, and bladder to fully emptying while sitting    Currently in Pain? No/denies              [facility] PT Assessment - 11/09/20 0001      Assessment   Medical Diagnosis K59.09 (ICD-10-CM) -  Other constipation;R39.198 (ICD-10-CM) - Difficulty urinating;N81.89 (ICD-10-CM) - Pelvic floor weakness    Referring Provider (PT) Tamela Gammon, NP    Onset Date/Surgical Date --   07/20/2011   Prior Therapy No      Precautions   Precautions None      Balance Screen   Has the patient fallen in the past 6 months No      [place] residence      Prior Function   Level of [place] Retired      Associate Professor   Overall Cognitive Status Within Functional Limits for tasks assessed      Posture/Postural Control   Posture Comments Rt ilium rotated forward and to the left      ROM / Strength   AROM / PROM / Strength AROM;PROM;Strength      AROM   Overall AROM Comments fwd flex limited by tight hamstring      PROM   Overall PROM Comments hip PROM normal      Strength   Overall Strength Comments core 3/5      Flexibility   Soft Tissue Assessment /Muscle Length yes    Hamstrings Rt 60%; Lt  70%      Palpation   SI assessment  see above Rt ilium anterior rotation    Palpation comment tight paraspinals lumbar-thoracic      Special Tests   Other special tests SLS instability Lt>Rt      Ambulation/Gait   Gait Pattern Within Functional Limits                      Objective measurements completed on examination: See above findings.     Pelvic Floor Special Questions - 11/09/20 0001    Prior Pelvic/Prostate Exam Yes    Prior Pregnancies Yes    Number of Pregnancies 1    Number of Vaginal Deliveries 1    Any difficulty with labor and deliveries --   4th degree tear   Currently Sexually Active Yes    Urinary Leakage Yes    Pad use small pad    Falling out feeling (prolapse) No    Skin Integrity Intact   dry and pale Rt>Lt   Pelvic Floor Internal Exam pt identity confirmed and internal soft tissue assessed withinformed consent    Exam Type Rectal    Palpation low tone anal sphincters, contracts  with evacuations attempts    Strength weak squeeze, no lift    Strength # of reps 1    Strength # of seconds 2    Tone high puborectalis, low anal sphincters            [facility] Adult PT Treatment/Exercise - 11/09/20 0001      Self-Care   Self-Care Other Self-Care Comments    Other Self-Care Comments  moisturizing and stretches to pelvic floor for increased blood flow and tissue health                  PT Education - 11/08/20 1716    Education Details stretch pelvic floor and moisturize    Person(s) Educated Patient    Methods Explanation;Handout;Verbal cues;Demonstration    Comprehension Verbalized understanding               PT Long Term Goals - 11/09/20 0841      PT LONG TERM GOAL #1   Title Pt will be ind with HEP for maintaining functional strength    Time 12    Period Weeks    Status New    Target Date 01/31/21      PT LONG TERM GOAL #2   Title Pt will be able to have BM at least every other day with improved pelvic floor muscle strength and coordination    Time 12    Period Weeks    Status New    Target Date 01/31/21      PT LONG TERM GOAL #3   Title Pt will be able to void and feel that she is emptying bladder fully at least 90% of the time    Time 12    Period Weeks    Status New    Target Date 01/31/21      PT LONG TERM GOAL #4   Title Pt will not have bladder leakage after standing up from voiding    Time 12    Period Weeks    Status New    Target Date 01/31/21                  Plan - 11/08/20 1100    Clinical Impression Statement Pt presents to skilled PT due to constipation that has worsened  and is also having difficulty emptying her bladder at times.  Pt demonstrates dyssynergia of pelvic floor with internal assessment done rectally.  Puborectalis is high tone and sphincters tighten when she attempts to evacuate.  Pt has low tone of sphincter muscles with initial palpation and then muscles fatigue quickly after contracting.   She has more tension Rt side of pelvic floor and skin is pale Lt>Rt and dryness demonstrating decreased blood flow.  Lt hamstring is tighter than Rt side.  Pt has diminished activation and low tone of abdominal muscles and palpation of abdomen reveals some increased firmness of colon from transverse to desecending. When standing from fwd flex to pt goes Rt and she is more unsteady with SLS on the Lt side.  Her Rt ilium ant rotated anteriorly and pelvis rotating to the Lt in supine.  Pt will benefit from skilled PT to address these impairments for improved function and quality of life.    Personal Factors and Comorbidities Comorbidity 3+;Time since onset of injury/illness/exacerbation    Comorbidities osteoporosis, diverticulitis, IBS, PTSD    Examination-Activity Limitations Toileting;Continence    Examination-Participation Restrictions Community Activity    Stability/Clinical Decision Making Evolving/Moderate complexity    Clinical Decision Making Low    PT Frequency 1x / week    PT Duration 12 weeks    PT Treatment/Interventions ADLs/Self Care Home Management;Biofeedback;Electrical Stimulation;Cryotherapy;Moist Heat;Therapeutic activities;Therapeutic exercise;Neuromuscular re-education;Patient/family education;Manual techniques;Dry needling;Taping;Passive range of motion    PT Next Visit Plan correct Rt ilium rotation; abdominal fascial release; breathing and bulging pelvic floor in sitting    PT Home Exercise Plan moisturizing and pelvic stretch    Consulted and Agree with Plan of Care Patient           Patient will benefit from skilled therapeutic intervention in order to improve the following deficits and impairments:  Impaired flexibility,Increased muscle spasms,Decreased coordination,Increased fascial restricitons,Decreased strength,Decreased endurance,Decreased range of motion  Visit Diagnosis: Cramp and spasm  Unspecified lack of coordination     Problem List Patient Active  Problem List   Diagnosis Date Noted  . Vitamin D insufficiency 02/24/2020  . Osteoporosis without current pathological fracture 02/24/2019    Jule Ser, PT 11/09/2020, 9:09 AM  [facility] [address], [place], [place], [address] Phone: [phone]   Fax:  [phone]  Name: Tracy [name] MRN: [id number] Date of Birth: 16-Nov-1950

## 2020-11-08 NOTE — Patient Instructions (Addendum)
STRETCHING THE PELVIC FLOOR MUSCLES NO DILATOR  Supplies . Vaginal lubricant . Mirror (optional) . Gloves (optional) or clean hands Positioning . Start in a semi-reclined position with your head propped up. Bend your knees and place your thumb or finger at the vaginal opening. Procedure . Apply a moderate amount of lubricant on the outer skin of your vagina, the labia minora.  Apply additional lubricant to your finger. Marland Kitchen Spread the skin away from the vaginal opening. Place the end of your finger at the opening. . Do a maximum contraction of the pelvic floor muscles. Tighten the vagina and the anus maximally and relax. . When you know they are relaxed, gently and slowly insert your finger into your vagina, directing your finger slightly downward, for 2-3 inches of insertion. . Relax and stretch the 6 o'clock position . Hold each stretch for _30-60 seconds, no pain more than 3/10 . Repeat the stretching in the 4 o'clock and 8 o'clock positions. . Next gently move your finger in a "U" shape  several times.  . You can also enter a second finger to work to spread the vaginal opening wider from 3:00-6:00 and 6:00-9:00 or 3:00-9:00 . Perform daily or every other day . Once you have accomplished the techniques you may try them in standing with one foot resting on the tub, or in other positions.  This is a good stretch to do in the shower if you don't need to use lubricant.  This can be done at 35 weeks or later in your pregnancy.   Moisturizers . They are used in the vagina to hydrate the mucous membrane that make up the vaginal canal. . Designed to keep a more normal acid balance (ph) . Once placed in the vagina, it will last between two to three days.  . Use 2-3 times per week at bedtime  . Ingredients to avoid is glycerin and fragrance, can increase chance of infection . Should not be used just before sex due to causing irritation . Most are gels administered either in a tampon-shaped  applicator or as a vaginal suppository. They are non-hormonal.   Types of Moisturizers(internal use)  . Vitamin E vaginal suppositories- Whole foods, Amazon . Moist Again . Coconut oil- can break down condoms . Julva- (Do no use if on Tamoxifen) amazon . Yes moisturizer- amazon . NeuEve Silk , NeuEve Silver for menopausal or over 65 (if have severe vaginal atrophy or cancer treatments use NeuEve Silk for  1 month than move to [employer])- [employer], MapleFlower.dk . Olive and Bee intimate cream- www.oliveandbee.com.au . Mae vaginal Butler . Aloe .    Creams to use externally on the Vulva area  Albertson's (good for for cancer patients that had radiation to the area)- [place] or [employer].FlyingBasics.com.br  V-magic cream - amazon  Julva-amazon  Vital "V [place] salve ( help moisturize and help with thinning vulvar area, does have [place] by Irwin Brakeman labial moisturizer ([place],   Coconut or olive oil  aloe   Things to avoid in the vaginal area . Do not use things to irritate the vulvar area . No lotions just specialized creams for the vulva area- Neogyn, V-magic, No soaps; can use Aveeno or Calendula cleanser if needed. Must be gentle . No deodorants . No douches . Good to sleep without underwear to let the vaginal area to air out . No scrubbing: spread the lips  to let warm water rinse over labias and pat dry  [facility] [address], [place], [place] [address] Phone # [phone] Fax [phone]

## 2020-11-09 ENCOUNTER — Encounter: Payer: Self-pay | Admitting: Physical Therapy

## 2020-11-15 ENCOUNTER — Ambulatory Visit: Payer: Medicare HMO | Admitting: Physical Therapy

## 2020-11-15 ENCOUNTER — Other Ambulatory Visit: Payer: Self-pay

## 2020-11-15 DIAGNOSIS — R279 Unspecified lack of coordination: Secondary | ICD-10-CM

## 2020-11-15 DIAGNOSIS — R252 Cramp and spasm: Secondary | ICD-10-CM | POA: Diagnosis not present

## 2020-11-15 NOTE — Patient Instructions (Signed)
Access Code: [email or web address] URL: [email or web address] Date: 11/15/2020 Prepared by: Jari Favre  Exercises Supine Pelvic Floor Stretch - 1 x daily - 7 x weekly - 1 sets - 3 reps - 30 sec hold Standing Hamstring Stretch with Step - 1 x daily - 7 x weekly - 1 sets - 3 reps - 30 sec hold

## 2020-11-15 NOTE — Therapy (Signed)
[facility] [address] [place], [place], [address] Phone: [phone]   Fax:  [phone]  Physical Therapy Treatment  Patient Details  Name: Tracy [name] MRN: [id number] Date of Birth: [birth year] Referring Provider (PT): Tamela Gammon, NP   Encounter Date: 11/15/2020   PT End of Session - 11/15/20 1237     Visit Number 2    Date for PT Re-Evaluation 01/31/21    Authorization Type Aetna    PT Start Time 1147    PT Stop Time 1233    PT Time Calculation (min) 46 min    Activity Tolerance Patient tolerated treatment well    Behavior During Therapy [facility] for tasks assessed/performed             Past Medical History:  Diagnosis Date   ASCUS favor benign 2000   negative pap smears since   Constipation    Diverticulitis    HSV (herpes simplex virus) anogenital infection    IBS (irritable bowel syndrome)    Postmenopausal osteoporosis 12/2018   T score -2.5    Past Surgical History:  Procedure Laterality Date   APPENDECTOMY     COLON SURGERY  2012   Uterine polyp      There were no vitals filed for this visit.   Subjective Assessment - 11/15/20 1236     Subjective Pt had questions about the stretching and what she was feeling.    Patient Stated Goals constipation to be improved, and bladder to fully emptying while sitting    Currently in Pain? No/denies                               [facility] Adult PT Treatment/Exercise - 11/15/20 0001       Exercises   Exercises Lumbar      Lumbar Exercises: Stretches   Active Hamstring Stretch Right;Left;20 seconds    Other Lumbar Stretch Exercise happy baby    Other Lumbar Stretch Exercise MET to Lt ilium for reduced anterior rotation      Manual Therapy   Manual Therapy Myofascial release;Soft tissue mobilization    Myofascial Release large intestine, rectum and bladder done abdominally                          PT Long Term Goals - 11/15/20 1244       PT LONG TERM GOAL #1   Title Pt will be ind with HEP for maintaining functional strength    Status On-going                   Plan - 11/15/20 1237     Clinical Impression Statement Pt had good response to fascial release around transverse colon and descending colon.  Pt began having pressure around the duodenal juncture after doing happy baby stretch for about 2 minutes.  Pt was given initial stretches and information on pelvic release tool for BM.    PT Treatment/Interventions ADLs/Self Care Home Management;Biofeedback;Electrical Stimulation;Cryotherapy;Moist Heat;Therapeutic activities;Therapeutic exercise;Neuromuscular re-education;Patient/family education;Manual techniques;Dry needling;Taping;Passive range of motion    PT Next Visit Plan breathing and bulging pelvic floor in sitting; lumbar fascial release and maybe dry needling?    PT Home Exercise Plan moisturizing and pelvic stretch, Access Code: [email or web address]    Consulted and Agree with Plan of Care Patient  Patient will benefit from skilled therapeutic intervention in order to improve the following deficits and impairments:  Impaired flexibility, Increased muscle spasms, Decreased coordination, Increased fascial restricitons, Decreased strength, Decreased endurance, Decreased range of motion  Visit Diagnosis: Cramp and spasm  Unspecified lack of coordination     Problem List Patient Active Problem List   Diagnosis Date Noted   Vitamin D insufficiency 02/24/2020   Osteoporosis without current pathological fracture 02/24/2019    Camillo Flaming Quandra Fedorchak, PT 11/15/2020, 12:47 PM  [facility] [address], [place], [place], [address] Phone: [phone]   Fax:  [phone]  Name: Tracy [name] MRN: [id number] Date of Birth: 1951-03-03

## 2020-11-19 DIAGNOSIS — M542 Cervicalgia: Secondary | ICD-10-CM | POA: Diagnosis not present

## 2020-11-19 DIAGNOSIS — K581 Irritable bowel syndrome with constipation: Secondary | ICD-10-CM | POA: Diagnosis not present

## 2020-11-22 ENCOUNTER — Other Ambulatory Visit: Payer: Self-pay

## 2020-11-22 ENCOUNTER — Ambulatory Visit: Payer: Medicare HMO | Admitting: Physical Therapy

## 2020-11-22 DIAGNOSIS — R279 Unspecified lack of coordination: Secondary | ICD-10-CM | POA: Diagnosis not present

## 2020-11-22 DIAGNOSIS — R252 Cramp and spasm: Secondary | ICD-10-CM | POA: Diagnosis not present

## 2020-11-22 NOTE — Therapy (Signed)
[facility] [address], [place], [place], [address] Phone: [phone]   Fax:  [phone]  Physical Therapy Treatment  Patient Details  Name: Tracy [name] MRN: [id number] Date of Birth: [birth year] Referring Provider (PT): Tamela Gammon, NP   Encounter Date: 11/22/2020   PT End of Session - 11/22/20 1108     Visit Number 3    Date for PT Re-Evaluation 01/31/21    Authorization Type Aetna    PT Start Time 1103    PT Stop Time 1145    PT Time Calculation (min) 42 min    Activity Tolerance Patient tolerated treatment well    Behavior During Therapy [facility] for tasks assessed/performed             Past Medical History:  Diagnosis Date   ASCUS favor benign 2000   negative pap smears since   Constipation    Diverticulitis    HSV (herpes simplex virus) anogenital infection    IBS (irritable bowel syndrome)    Postmenopausal osteoporosis 12/2018   T score -2.5    Past Surgical History:  Procedure Laterality Date   APPENDECTOMY     COLON SURGERY  2012   Uterine polyp      There were no vitals filed for this visit.   Subjective Assessment - 11/22/20 1107     Subjective Pt states she has been very bloated, but she has been having a BM every day.  Pt states she has been drinking more water, sometimes over 80 oz.    Pertinent History hx of 4th degree tear and 2 previous miscarriages D&C    Patient Stated Goals constipation to be improved, and bladder to fully emptying while sitting    Currently in Pain? No/denies                               [facility] Adult PT Treatment/Exercise - 11/22/20 0001       Lumbar Exercises: Seated   Other Seated Lumbar Exercises sidebending and pelvic tilt ant/post with fascial release to tailbone      Lumbar Exercises: Quadruped   Other Quadruped Lumbar Exercises rocking with fascial release around coccyx      Manual Therapy   Manual Therapy  Internal Pelvic Floor    Manual therapy comments pt identity confirmed and treated with consent    Internal Pelvic Floor iliococcygeus and coccygeus; also noticed a urethra prolapse                         PT Long Term Goals - 11/15/20 1244       PT LONG TERM GOAL #1   Title Pt will be ind with HEP for maintaining functional strength    Status On-going                   Plan - 11/22/20 1125     Clinical Impression Statement Pt felt different level of tension that felt "different" and not bad after today's treatment.  Pt tolerated release to iliococc and coccygeus muscles.  Pt had good release from coccyx mobs sitting and in quadruped.  pt did make some progress with more regular BMs.  Pt will benefit from skilled PT to continue to work on trunk stability and coordination to improve toileting and abdominal pressure.    PT Treatment/Interventions ADLs/Self Care Home Management;Biofeedback;Electrical Stimulation;Cryotherapy;Moist Heat;Therapeutic activities;Therapeutic exercise;Neuromuscular  re-education;Patient/family education;Manual techniques;Dry needling;Taping;Passive range of motion    PT Next Visit Plan core strengthening, lumbar fascial release and maybe dry needling?- was discussed previous but she felt unsure, poise sample for prolapse    PT Home Exercise Plan moisturizing and pelvic stretch, Access Code: [email or web address]    Consulted and Agree with Plan of Care Patient             Patient will benefit from skilled therapeutic intervention in order to improve the following deficits and impairments:  Impaired flexibility, Increased muscle spasms, Decreased coordination, Increased fascial restricitons, Decreased strength, Decreased endurance, Decreased range of motion  Visit Diagnosis: No diagnosis found.     Problem List Patient Active Problem List   Diagnosis Date Noted   Vitamin D insufficiency 02/24/2020   Osteoporosis without current pathological  fracture 02/24/2019    Jule Ser, PT 11/22/2020, 12:13 PM  [facility] [address] [place], [place], [address] Phone: [phone]   Fax:  [phone]  Name: Tracy [name] MRN: [id number] Date of Birth: [birth year]

## 2020-11-26 DIAGNOSIS — H04123 Dry eye syndrome of bilateral lacrimal glands: Secondary | ICD-10-CM | POA: Diagnosis not present

## 2020-12-02 ENCOUNTER — Encounter: Payer: Self-pay | Admitting: Physical Therapy

## 2020-12-02 ENCOUNTER — Other Ambulatory Visit: Payer: Self-pay

## 2020-12-02 ENCOUNTER — Ambulatory Visit: Payer: Medicare HMO | Admitting: Physical Therapy

## 2020-12-02 DIAGNOSIS — R279 Unspecified lack of coordination: Secondary | ICD-10-CM | POA: Diagnosis not present

## 2020-12-02 DIAGNOSIS — R252 Cramp and spasm: Secondary | ICD-10-CM | POA: Diagnosis not present

## 2020-12-02 NOTE — Therapy (Signed)
[facility] [address], [place], [place], [address] Phone: [phone]   Fax:  [phone]  Physical Therapy Treatment  Patient Details  Name: Tracy [name] MRN: [id number] Date of Birth: 1950/11/29 Referring Provider (PT): Tamela Gammon, NP   Encounter Date: 12/02/2020   PT End of Session - 12/02/20 1434     Visit Number 4    Date for PT Re-Evaluation 01/31/21    Authorization Type Aetna    PT Start Time 1401    PT Stop Time 6712    PT Time Calculation (min) 48 min    Activity Tolerance Patient tolerated treatment well    Behavior During Therapy [facility] for tasks assessed/performed             Past Medical History:  Diagnosis Date   ASCUS favor benign 2000   negative pap smears since   Constipation    Diverticulitis    HSV (herpes simplex virus) anogenital infection    IBS (irritable bowel syndrome)    Postmenopausal osteoporosis 12/2018   T score -2.5    Past Surgical History:  Procedure Laterality Date   APPENDECTOMY     COLON SURGERY  2012   Uterine polyp      There were no vitals filed for this visit.   Subjective Assessment - 12/02/20 1432     Subjective Pt states she was emptying more the last two days but feels like too much    Currently in Pain? No/denies                               [facility] Adult PT Treatment/Exercise - 12/02/20 0001       Neuro Re-ed    Neuro Re-ed Details  tactile cue to contract and relax pelvic floor      Lumbar Exercises: Stretches   Other Lumbar Stretch Exercise hip rotatoin, figure 4, hamstring      Lumbar Exercises: Supine   AB Set Limitations core set    Bridge with [employer] Limitations ball squeeze with kegel    Other Supine Lumbar Exercises marching and ball roll and ball overhead - 10x each with kegel and breathing      Manual Therapy   Myofascial Release liver sidelying and supine - improved movement to midline                          PT Long Term Goals - 11/15/20 1244       PT LONG TERM GOAL #1   Title Pt will be ind with HEP for maintaining functional strength    Status On-going                   Plan - 12/02/20 1631     Clinical Impression Statement Today's session focused on core and pelvic floor strength.  Pt had restriction in upper Rt quadrant around liver and release toward midline partially with mobilization techniques.  Pt was given exercises and stretches to add to HEP to work on contract and relax with breathing    PT Treatment/Interventions ADLs/Self Care Home Management;Biofeedback;Electrical Stimulation;Cryotherapy;Moist Heat;Therapeutic activities;Therapeutic exercise;Neuromuscular re-education;Patient/family education;Manual techniques;Dry needling;Taping;Passive range of motion    PT Next Visit Plan liver mobs, contract and relax pelvic floor and f/u on added HEP    PT Home Exercise Plan moisturizing and pelvic stretch, Access Code: [email or web address]    Consulted  and Agree with Plan of Care Patient             Patient will benefit from skilled therapeutic intervention in order to improve the following deficits and impairments:  Impaired flexibility, Increased muscle spasms, Decreased coordination, Increased fascial restricitons, Decreased strength, Decreased endurance, Decreased range of motion  Visit Diagnosis: Cramp and spasm  Unspecified lack of coordination     Problem List Patient Active Problem List   Diagnosis Date Noted   Vitamin D insufficiency 02/24/2020   Osteoporosis without current pathological fracture 02/24/2019    Jule Ser, PT 12/02/2020, 5:15 PM  [facility] [address] [place], [place], [address] Phone: [phone]   Fax:  [phone]  Name: Tracy [name] MRN: [id number] Date of Birth: [birth year]

## 2020-12-03 DIAGNOSIS — M542 Cervicalgia: Secondary | ICD-10-CM | POA: Diagnosis not present

## 2020-12-16 ENCOUNTER — Ambulatory Visit: Payer: Medicare HMO | Attending: Nurse Practitioner | Admitting: Physical Therapy

## 2020-12-16 ENCOUNTER — Other Ambulatory Visit: Payer: Self-pay

## 2020-12-16 DIAGNOSIS — R252 Cramp and spasm: Secondary | ICD-10-CM | POA: Insufficient documentation

## 2020-12-16 DIAGNOSIS — R279 Unspecified lack of coordination: Secondary | ICD-10-CM

## 2020-12-16 NOTE — Therapy (Signed)
[facility] [address] [place], [place], [address] Phone: [phone]   Fax:  [phone]  Physical Therapy Treatment  Patient Details  Name: Tracy [name] MRN: [id number] Date of Birth: 1951/05/03 Referring Provider (PT): Tamela Gammon, NP   Encounter Date: 12/16/2020   PT End of Session - 12/16/20 1113     Visit Number 5    Date for PT Re-Evaluation 01/31/21    Authorization Type Aetna    PT Start Time 1109   late   PT Stop Time 1147    PT Time Calculation (min) 38 min    Activity Tolerance Patient tolerated treatment well    Behavior During Therapy [facility] for tasks assessed/performed             Past Medical History:  Diagnosis Date   ASCUS favor benign 2000   negative pap smears since   Constipation    Diverticulitis    HSV (herpes simplex virus) anogenital infection    IBS (irritable bowel syndrome)    Postmenopausal osteoporosis 12/2018   T score -2.5    Past Surgical History:  Procedure Laterality Date   APPENDECTOMY     COLON SURGERY  2012   Uterine polyp      There were no vitals filed for this visit.   Subjective Assessment - 12/16/20 1113     Subjective Pt states she had diarrhea after she was here for 2 days, then was normal for 4 days, then constipation for the last week and was maybe due to being off her diet    Pertinent History hx of 4th degree tear and 2 previous miscarriages D&C    Patient Stated Goals constipation to be improved, and bladder to fully emptying while sitting    Currently in Pain? No/denies                               [facility] Adult PT Treatment/Exercise - 12/16/20 0001       Neuro Re-ed    Neuro Re-ed Details  biofeedback with seated exercises with and without ball, sporadic lines needed cues to keep                         PT Long Term Goals - 11/15/20 1244       PT LONG TERM GOAL #1   Title Pt will be ind  with HEP for maintaining functional strength    Status On-going                   Plan - 12/16/20 1205     Clinical Impression Statement Pt did very well with the biofeedback today and found the information from seeing the muscle enurance very useful. Pt was able to understand how to rest in between reps with the use of visual feedback.  Pt will benefit from skilled PT to continue working on strength and coordination.    PT Treatment/Interventions ADLs/Self Care Home Management;Biofeedback;Electrical Stimulation;Cryotherapy;Moist Heat;Therapeutic activities;Therapeutic exercise;Neuromuscular re-education;Patient/family education;Manual techniques;Dry needling;Taping;Passive range of motion    PT Next Visit Plan biofeedback try in standing - she wants to save information    Consulted and Agree with Plan of Care Patient             Patient will benefit from skilled therapeutic intervention in order to improve the following deficits and impairments:  Impaired flexibility, Increased muscle spasms,  Decreased coordination, Increased fascial restricitons, Decreased strength, Decreased endurance, Decreased range of motion  Visit Diagnosis: Cramp and spasm  Unspecified lack of coordination     Problem List Patient Active Problem List   Diagnosis Date Noted   Vitamin D insufficiency 02/24/2020   Osteoporosis without current pathological fracture 02/24/2019    Camillo Flaming Zani Kyllonen, PT 12/16/2020, 12:08 PM  [facility] [address], [place], [place], [address] Phone: [phone]   Fax:  [phone]  Name: Tracy [name] MRN: [id number] Date of Birth: 08-25-1950

## 2020-12-20 DIAGNOSIS — I10 Essential (primary) hypertension: Secondary | ICD-10-CM | POA: Diagnosis not present

## 2020-12-20 DIAGNOSIS — Z8249 Family history of ischemic heart disease and other diseases of the circulatory system: Secondary | ICD-10-CM | POA: Diagnosis not present

## 2020-12-20 DIAGNOSIS — B009 Herpesviral infection, unspecified: Secondary | ICD-10-CM | POA: Diagnosis not present

## 2020-12-20 DIAGNOSIS — Z008 Encounter for other general examination: Secondary | ICD-10-CM | POA: Diagnosis not present

## 2020-12-20 DIAGNOSIS — K581 Irritable bowel syndrome with constipation: Secondary | ICD-10-CM | POA: Diagnosis not present

## 2020-12-20 DIAGNOSIS — Z6827 Body mass index (BMI) 27.0-27.9, adult: Secondary | ICD-10-CM | POA: Diagnosis not present

## 2020-12-20 DIAGNOSIS — Z7722 Contact with and (suspected) exposure to environmental tobacco smoke (acute) (chronic): Secondary | ICD-10-CM | POA: Diagnosis not present

## 2020-12-20 DIAGNOSIS — R32 Unspecified urinary incontinence: Secondary | ICD-10-CM | POA: Diagnosis not present

## 2020-12-20 DIAGNOSIS — E663 Overweight: Secondary | ICD-10-CM | POA: Diagnosis not present

## 2020-12-20 DIAGNOSIS — G8929 Other chronic pain: Secondary | ICD-10-CM | POA: Diagnosis not present

## 2020-12-20 DIAGNOSIS — H04129 Dry eye syndrome of unspecified lacrimal gland: Secondary | ICD-10-CM | POA: Diagnosis not present

## 2020-12-23 ENCOUNTER — Ambulatory Visit: Payer: Medicare HMO | Admitting: Physical Therapy

## 2020-12-23 ENCOUNTER — Encounter: Payer: Self-pay | Admitting: Physical Therapy

## 2020-12-23 ENCOUNTER — Other Ambulatory Visit: Payer: Self-pay

## 2020-12-23 DIAGNOSIS — R252 Cramp and spasm: Secondary | ICD-10-CM | POA: Diagnosis not present

## 2020-12-23 DIAGNOSIS — R279 Unspecified lack of coordination: Secondary | ICD-10-CM | POA: Diagnosis not present

## 2020-12-23 NOTE — Therapy (Signed)
[facility] [address], [place], [place], [address] Phone: [phone]   Fax:  [phone]  Physical Therapy Treatment  Patient Details  Name: Tracy [name] MRN: [id number] Date of Birth: 01-08-1951 Referring Provider (PT): Tamela Gammon, NP   Encounter Date: 12/23/2020   PT End of Session - 12/23/20 1102     Visit Number 6    Date for PT Re-Evaluation 01/31/21    Authorization Type Aetna    PT Start Time 1102    PT Stop Time 1142    PT Time Calculation (min) 40 min    Activity Tolerance Patient tolerated treatment well    Behavior During Therapy [facility] for tasks assessed/performed             Past Medical History:  Diagnosis Date   ASCUS favor benign 2000   negative pap smears since   Constipation    Diverticulitis    HSV (herpes simplex virus) anogenital infection    IBS (irritable bowel syndrome)    Postmenopausal osteoporosis 12/2018   T score -2.5    Past Surgical History:  Procedure Laterality Date   APPENDECTOMY     COLON SURGERY  2012   Uterine polyp      There were no vitals filed for this visit.   Subjective Assessment - 12/23/20 1104     Subjective Pt had company last week because there was always something going on and my diet was off. My bladder emptying seems to be better.    Patient Stated Goals constipation to be improved, and bladder to fully emptying while sitting                               [facility] Adult PT Treatment/Exercise - 12/23/20 0001       Manual Therapy   Myofascial Release large intestine, rectum and bladder done abdominally, lumbar paraspinal                         PT Long Term Goals - 12/23/20 1105       PT LONG TERM GOAL #1   Title Pt will be ind with HEP for maintaining functional strength    Status On-going      PT LONG TERM GOAL #2   Title Pt will be able to have BM at least every other day with improved  pelvic floor muscle strength and coordination    Baseline has been close to achieving that but still has to use mirilax    Status On-going      PT LONG TERM GOAL #3   Title Pt will be able to void and feel that she is emptying bladder fully at least 90% of the time    Baseline has been better    Status On-going      PT LONG TERM GOAL #4   Title Pt will not have bladder leakage after standing up from voiding    Baseline haven't had that as much since drinking more water    Status Partially Met                   Plan - 12/23/20 1152     Clinical Impression Statement Pt has made progress with being able to empty bladder and no leakge and overall BM is more regular.  Pt was feeling more tension today so we did not do  kegel exercises and she is able to continue with the exercises at home.  Today's session focused on lumbar and abdominal fascial release.  pt had improved soft tissue length of lumbar paraspinals.  She will benefit from skilled PT to continue working on posutre, improved muscle length, muscle coordination.    PT Treatment/Interventions ADLs/Self Care Home Management;Biofeedback;Electrical Stimulation;Cryotherapy;Moist Heat;Therapeutic activities;Therapeutic exercise;Neuromuscular re-education;Patient/family education;Manual techniques;Dry needling;Taping;Passive range of motion    PT Next Visit Plan biofeedback try in standing - she wants to save information    PT Home Exercise Plan moisturizing and pelvic stretch, Access Code: [email or web address]    Consulted and Agree with Plan of Care Patient             Patient will benefit from skilled therapeutic intervention in order to improve the following deficits and impairments:  Impaired flexibility, Increased muscle spasms, Decreased coordination, Increased fascial restricitons, Decreased strength, Decreased endurance, Decreased range of motion  Visit Diagnosis: Cramp and spasm  Unspecified lack of coordination     Problem  List Patient Active Problem List   Diagnosis Date Noted   Vitamin D insufficiency 02/24/2020   Osteoporosis without current pathological fracture 02/24/2019    Camillo Flaming Jair Lindblad, PT 12/23/2020, 12:17 PM  [facility] [address] [place], [place], [address] Phone: [phone]   Fax:  [phone]  Name: Tracy [name] MRN: [id number] Date of Birth: 02/18/1951

## 2020-12-24 DIAGNOSIS — M542 Cervicalgia: Secondary | ICD-10-CM | POA: Diagnosis not present

## 2020-12-30 ENCOUNTER — Ambulatory Visit: Payer: Medicare HMO | Admitting: Physical Therapy

## 2020-12-30 ENCOUNTER — Other Ambulatory Visit: Payer: Self-pay

## 2020-12-30 ENCOUNTER — Encounter: Payer: Self-pay | Admitting: Physical Therapy

## 2020-12-30 DIAGNOSIS — R279 Unspecified lack of coordination: Secondary | ICD-10-CM | POA: Diagnosis not present

## 2020-12-30 DIAGNOSIS — R252 Cramp and spasm: Secondary | ICD-10-CM

## 2020-12-30 NOTE — Therapy (Signed)
[facility] [address] [place], [place], [address] Phone: [phone]   Fax:  [phone]  Physical Therapy Treatment  Patient Details  Name: Tracy [name] MRN: [id number] Date of Birth: Feb 22, 1951 Referring Provider (PT): Tamela Gammon, NP   Encounter Date: 12/30/2020   PT End of Session - 12/30/20 1105     Visit Number 7    Date for PT Re-Evaluation 01/31/21    Authorization Type Aetna    PT Start Time 2563    PT Stop Time 1142    PT Time Calculation (min) 40 min             Past Medical History:  Diagnosis Date   ASCUS favor benign 2000   negative pap smears since   Constipation    Diverticulitis    HSV (herpes simplex virus) anogenital infection    IBS (irritable bowel syndrome)    Postmenopausal osteoporosis 12/2018   T score -2.5    Past Surgical History:  Procedure Laterality Date   APPENDECTOMY     COLON SURGERY  2012   Uterine polyp      There were no vitals filed for this visit.   Subjective Assessment - 12/30/20 1106     Subjective I am more aware of the pelvic floor and can do 3 reps but I feel a little stronger    Patient Stated Goals constipation to be improved, and bladder to fully emptying while sitting    Currently in Pain? No/denies                               [facility] Adult PT Treatment/Exercise - 12/30/20 0001       Lumbar Exercises: Standing   Other Standing Lumbar Exercises pallof press red band      Lumbar Exercises: Supine   Other Supine Lumbar Exercises exhale on exertion- big red and little yellow balls overhead TC to ensure correct pelvic floor activation, red band shoulder ext - exhaling 20x                         PT Long Term Goals - 12/23/20 1105       PT LONG TERM GOAL #1   Title Pt will be ind with HEP for maintaining functional strength    Status On-going      PT LONG TERM GOAL #2   Title Pt will be able  to have BM at least every other day with improved pelvic floor muscle strength and coordination    Baseline has been close to achieving that but still has to use mirilax    Status On-going      PT LONG TERM GOAL #3   Title Pt will be able to void and feel that she is emptying bladder fully at least 90% of the time    Baseline has been better    Status On-going      PT LONG TERM GOAL #4   Title Pt will not have bladder leakage after standing up from voiding    Baseline haven't had that as much since drinking more water    Status Partially Met                   Plan - 12/30/20 1138     Clinical Impression Statement Pt is making good progress and still reports less bloating.  Pt  was able to progress strength with more challenging exercises and able to correctly perform exercises with core engaged on the exhale.  Pt will benefit from skilled PT to ensure she is ind with final HEP    PT Treatment/Interventions ADLs/Self Care Home Management;Biofeedback;Electrical Stimulation;Cryotherapy;Moist Heat;Therapeutic activities;Therapeutic exercise;Neuromuscular re-education;Patient/family education;Manual techniques;Dry needling;Taping;Passive range of motion    PT Next Visit Plan final HEP standing exercises    PT Home Exercise Plan moisturizing and pelvic stretch, Access Code: [email or web address]    Consulted and Agree with Plan of Care Patient             Patient will benefit from skilled therapeutic intervention in order to improve the following deficits and impairments:  Impaired flexibility, Increased muscle spasms, Decreased coordination, Increased fascial restricitons, Decreased strength, Decreased endurance, Decreased range of motion  Visit Diagnosis: Cramp and spasm  Unspecified lack of coordination     Problem List Patient Active Problem List   Diagnosis Date Noted   Vitamin D insufficiency 02/24/2020   Osteoporosis without current pathological fracture 02/24/2019    Camillo Flaming Doneshia Hill, PT 12/30/2020, 11:47 AM  [facility] [address] [place], [place], [address] Phone: [phone]   Fax:  [phone]  Name: AREIL OTTEY MRN: [id number] Date of Birth: 04-14-1951

## 2021-01-03 DIAGNOSIS — M2550 Pain in unspecified joint: Secondary | ICD-10-CM | POA: Diagnosis not present

## 2021-01-03 DIAGNOSIS — I10 Essential (primary) hypertension: Secondary | ICD-10-CM | POA: Diagnosis not present

## 2021-01-03 DIAGNOSIS — R69 Illness, unspecified: Secondary | ICD-10-CM | POA: Diagnosis not present

## 2021-01-05 DIAGNOSIS — Z1231 Encounter for screening mammogram for malignant neoplasm of breast: Secondary | ICD-10-CM | POA: Diagnosis not present

## 2021-01-06 ENCOUNTER — Encounter: Payer: Self-pay | Admitting: Physical Therapy

## 2021-01-06 ENCOUNTER — Ambulatory Visit: Payer: Medicare HMO | Attending: Nurse Practitioner | Admitting: Physical Therapy

## 2021-01-06 ENCOUNTER — Other Ambulatory Visit: Payer: Self-pay

## 2021-01-06 DIAGNOSIS — R252 Cramp and spasm: Secondary | ICD-10-CM | POA: Diagnosis not present

## 2021-01-06 DIAGNOSIS — R279 Unspecified lack of coordination: Secondary | ICD-10-CM | POA: Diagnosis not present

## 2021-01-06 NOTE — Therapy (Signed)
[facility] [address] [place], [place], [address] Phone: [phone]   Fax:  [phone]  Physical Therapy Treatment  Patient Details  Name: Tracy [name] MRN: [id number] Date of Birth: 1951-01-08 Referring Provider (PT): Tamela Gammon, NP   Encounter Date: 01/06/2021   PT End of Session - 01/06/21 1419     Visit Number 8    Date for PT Re-Evaluation 01/31/21    Authorization Type [employer]    PT Start Time 1100    PT Stop Time 1147    PT Time Calculation (min) 47 min    Activity Tolerance Patient tolerated treatment well    Behavior During Therapy [facility] for tasks assessed/performed             Past Medical History:  Diagnosis Date   ASCUS favor benign 2000   negative pap smears since   Constipation    Diverticulitis    HSV (herpes simplex virus) anogenital infection    IBS (irritable bowel syndrome)    Postmenopausal osteoporosis 12/2018   T score -2.5    Past Surgical History:  Procedure Laterality Date   APPENDECTOMY     COLON SURGERY  2012   Uterine polyp      There were no vitals filed for this visit.   Subjective Assessment - 01/06/21 1103     Subjective I feel like I have a lot of good tools to use at this point.  I have a lot of arthritis in the neck those issue overshadowing other things this week    Patient Stated Goals constipation to be improved, and bladder to fully emptying while sitting                               [facility] Adult PT Treatment/Exercise - 01/06/21 0001       Self-Care   Other Self-Care Comments  reviewed HEP and discussed status      Manual Therapy   Manual Therapy Myofascial release;Soft tissue mobilization    Soft tissue mobilization cervical paraspinlas, scalenes, suboccipitals, pecs on Rt side    Myofascial Release large intestine, rectum and bladder done abdominall                         PT Long Term Goals -  01/06/21 1106       PT LONG TERM GOAL #1   Title Pt will be ind with HEP for maintaining functional strength    Status Achieved      PT LONG TERM GOAL #2   Title Pt will be able to have BM at least every other day with improved pelvic floor muscle strength and coordination    Baseline have been using mirilax and other tools and that is happening    Status Achieved      PT LONG TERM GOAL #3   Title Pt will be able to void and feel that she is emptying bladder fully at least 90% of the time    Baseline I am more aware of relaxing and feel complete emptying 80-85% of the time    Status Partially Met      PT LONG TERM GOAL #4   Title Pt will not have bladder leakage after standing up from voiding    Status Achieved  Plan - 01/06/21 1418     Clinical Impression Statement Pt has met or partially met all goals.  Recently she has not had any changes and she is ind with her HEP.  Pt will d/c from skilled PT today    PT Treatment/Interventions ADLs/Self Care Home Management;Biofeedback;Electrical Stimulation;Cryotherapy;Moist Heat;Therapeutic activities;Therapeutic exercise;Neuromuscular re-education;Patient/family education;Manual techniques;Dry needling;Taping;Passive range of motion    PT Next Visit Plan d/c    Consulted and Agree with Plan of Care Patient             Patient will benefit from skilled therapeutic intervention in order to improve the following deficits and impairments:  Impaired flexibility, Increased muscle spasms, Decreased coordination, Increased fascial restricitons, Decreased strength, Decreased endurance, Decreased range of motion  Visit Diagnosis: Cramp and spasm  Unspecified lack of coordination     Problem List Patient Active Problem List   Diagnosis Date Noted   Vitamin D insufficiency 02/24/2020   Osteoporosis without current pathological fracture 02/24/2019    Jule Ser, PT 01/06/2021, 2:23 PM  [facility] [address] [place], [place], [address] Phone: [phone]   Fax:  [phone]  Name: Tracy [name] MRN: [id number] Date of Birth: 08-12-1950   PHYSICAL THERAPY DISCHARGE SUMMARY  Visits from Start of Care: 8  Current functional level related to goals / functional outcomes:   See above goals Remaining deficits: See above details   Education / Equipment: HEP  Patient agrees to discharge. Patient goals were partially met. Patient is being discharged due to being pleased with the current functional level.  Gustavus Bryant, PT 01/06/21 2:24 PM

## 2021-01-07 DIAGNOSIS — M542 Cervicalgia: Secondary | ICD-10-CM | POA: Diagnosis not present

## 2021-01-18 DIAGNOSIS — M5414 Radiculopathy, thoracic region: Secondary | ICD-10-CM | POA: Diagnosis not present

## 2021-01-18 DIAGNOSIS — M531 Cervicobrachial syndrome: Secondary | ICD-10-CM | POA: Diagnosis not present

## 2021-01-18 DIAGNOSIS — M9902 Segmental and somatic dysfunction of thoracic region: Secondary | ICD-10-CM | POA: Diagnosis not present

## 2021-01-18 DIAGNOSIS — M9901 Segmental and somatic dysfunction of cervical region: Secondary | ICD-10-CM | POA: Diagnosis not present

## 2021-01-19 DIAGNOSIS — M542 Cervicalgia: Secondary | ICD-10-CM | POA: Diagnosis not present

## 2021-01-20 DIAGNOSIS — M9902 Segmental and somatic dysfunction of thoracic region: Secondary | ICD-10-CM | POA: Diagnosis not present

## 2021-01-20 DIAGNOSIS — M531 Cervicobrachial syndrome: Secondary | ICD-10-CM | POA: Diagnosis not present

## 2021-01-20 DIAGNOSIS — M9901 Segmental and somatic dysfunction of cervical region: Secondary | ICD-10-CM | POA: Diagnosis not present

## 2021-01-20 DIAGNOSIS — M5414 Radiculopathy, thoracic region: Secondary | ICD-10-CM | POA: Diagnosis not present

## 2021-01-21 DIAGNOSIS — H43813 Vitreous degeneration, bilateral: Secondary | ICD-10-CM | POA: Diagnosis not present

## 2021-01-21 DIAGNOSIS — H35371 Puckering of macula, right eye: Secondary | ICD-10-CM | POA: Diagnosis not present

## 2021-01-21 DIAGNOSIS — H04123 Dry eye syndrome of bilateral lacrimal glands: Secondary | ICD-10-CM | POA: Diagnosis not present

## 2021-01-22 DIAGNOSIS — M531 Cervicobrachial syndrome: Secondary | ICD-10-CM | POA: Diagnosis not present

## 2021-01-22 DIAGNOSIS — M9901 Segmental and somatic dysfunction of cervical region: Secondary | ICD-10-CM | POA: Diagnosis not present

## 2021-01-22 DIAGNOSIS — M9902 Segmental and somatic dysfunction of thoracic region: Secondary | ICD-10-CM | POA: Diagnosis not present

## 2021-01-22 DIAGNOSIS — M5414 Radiculopathy, thoracic region: Secondary | ICD-10-CM | POA: Diagnosis not present

## 2021-02-01 DIAGNOSIS — M9902 Segmental and somatic dysfunction of thoracic region: Secondary | ICD-10-CM | POA: Diagnosis not present

## 2021-02-01 DIAGNOSIS — M531 Cervicobrachial syndrome: Secondary | ICD-10-CM | POA: Diagnosis not present

## 2021-02-01 DIAGNOSIS — M9901 Segmental and somatic dysfunction of cervical region: Secondary | ICD-10-CM | POA: Diagnosis not present

## 2021-02-01 DIAGNOSIS — M5414 Radiculopathy, thoracic region: Secondary | ICD-10-CM | POA: Diagnosis not present

## 2021-02-02 DIAGNOSIS — M542 Cervicalgia: Secondary | ICD-10-CM | POA: Diagnosis not present

## 2021-02-09 DIAGNOSIS — M542 Cervicalgia: Secondary | ICD-10-CM | POA: Diagnosis not present

## 2021-02-10 DIAGNOSIS — M9902 Segmental and somatic dysfunction of thoracic region: Secondary | ICD-10-CM | POA: Diagnosis not present

## 2021-02-10 DIAGNOSIS — M9901 Segmental and somatic dysfunction of cervical region: Secondary | ICD-10-CM | POA: Diagnosis not present

## 2021-02-10 DIAGNOSIS — M531 Cervicobrachial syndrome: Secondary | ICD-10-CM | POA: Diagnosis not present

## 2021-02-10 DIAGNOSIS — M5414 Radiculopathy, thoracic region: Secondary | ICD-10-CM | POA: Diagnosis not present

## 2021-02-23 ENCOUNTER — Other Ambulatory Visit: Payer: Self-pay

## 2021-02-23 ENCOUNTER — Encounter: Payer: Self-pay | Admitting: Internal Medicine

## 2021-02-23 ENCOUNTER — Ambulatory Visit (INDEPENDENT_AMBULATORY_CARE_PROVIDER_SITE_OTHER): Payer: Medicare HMO | Admitting: Internal Medicine

## 2021-02-23 VITALS — BP 128/72 | HR 72 | Ht 63.0 in | Wt 159.4 lb

## 2021-02-23 DIAGNOSIS — E559 Vitamin D deficiency, unspecified: Secondary | ICD-10-CM

## 2021-02-23 DIAGNOSIS — M810 Age-related osteoporosis without current pathological fracture: Secondary | ICD-10-CM | POA: Diagnosis not present

## 2021-02-23 DIAGNOSIS — M818 Other osteoporosis without current pathological fracture: Secondary | ICD-10-CM

## 2021-02-23 LAB — VITAMIN D 25 HYDROXY (VIT D DEFICIENCY, FRACTURES): VITD: 32.85 ng/mL (ref 30.00–100.00)

## 2021-02-23 NOTE — Patient Instructions (Signed)
Please stop at the lab.  Please continue 2000 units vitamin D daily.  Please come back for a follow-up appointment in 1 year.

## 2021-02-23 NOTE — Progress Notes (Signed)
Patient ID: Tracy [name], female   DOB: [birth year], 70 y.o.   MRN: [id number]   This visit occurred during the SARS-CoV-2 public health emergency.  Safety protocols were in place, including screening questions prior to the visit, additional usage of staff PPE, and extensive cleaning of exam room while observing appropriate contact time as indicated for disinfecting solutions.   HPI  Tracy [name] is a 70 y.o.-year-old female, initially referred by Dr. Phineas Real, returning for follow-up for osteoporosis (OP).  Last visit a year ago.  Interim history: No fractures or fall since last visit. No dizziness, vertigo, blurry or double vision. She has IBS with constipation. Also, she developed acid reflux in the last 2 weeks. Also on probiotics. She has tinnitus. She was in PT. Sees a chiropractor - Dr. Linus Orn. Tracy [name] [facility] practice.  Reviewed history: Pt was dx with OP in 2020, previously osteopenia.  Reviewed previous DXA scan records: Date L1-L4 T score FN T score Total Hip T score   12/05/2018 ([facility])  -1.5 (-0.2%) RFN: -1.3 (+15.2%*) LFN: -2.1 (+0.7%) RTH: -2.0 (-0.8%) LTH: -2.5 (-2.5%)  08/15/2016 ([facility])  -1.5 RFN: -2.2 LFN: -2.2 RTH: -1.9 (+0.4%) LTH: -2.3 (-4.5%*)  07/27/2014 ([facility])  -1.4 RFN: -2.2 LFN: -2.2 RTH: -2.0 LTH: -2.1   She subluxated her R hip >> has right hip pain-chronic.  She had an episode of vertigo in 2013 - resolved. Sees Dr. Valetta Close - ophthalmology - "hole in the retina".  Previous osteoporosis treatments: -Fosamax (few years ago) >> GERD. Currently not on any medications.  Of note, she has a history of jaw bone loss. She has bruxism - has night guard.  She previously broke 3 teeth. She is now using Invisalign.  She has a history of vitamin D insufficiency: Lab Results  Component Value Date   VD25OH 31.7 02/24/2020   VD25OH 29.29 (L) 02/24/2019   Pt is on: - vitamin D 2000 units daily  + intermittent weightbearing exercises.  She walks/does  yoga, but also not consistently - not this summer 2/2 humidity.  She does not take high vitamin A doses.  Menopause was at 70 y/o.   + FH of osteoporosis -mother with pubic bone hairline fracture amount  No history of kidney stones, hyper or hypocalcemia or hyperparathyroidism: 07/08/2020: Calcium 9.8 Lab Results  Component Value Date   CALCIUM 9.4 11/19/2019   CALCIUM 10.0 02/24/2019    No history of thyrotoxicosis: Lab Results  Component Value Date   TSH 2.22 02/24/2020    No history of CKD: 10/07/2020: Creatinine 0.61 07/08/2020: BUN 11 Lab Results  Component Value Date   BUN 16 11/19/2019   CREATININE 0.51 11/19/2019    She has osteoarthritis.  RA was ruled out by Dr. Estanislado Pandy.  She sees a Restaurant manager, fast food for low back pain. She has a history of hypertension. She also sees Dr. Collene Mares for constipation >> on Linzess. Tried many different remedies >>  Not effective.  This is very bothersome for her.  ROS: + See HPI I reviewed pt's medications, allergies, PMH, social hx, family hx, and changes were documented in the history of present illness. Otherwise, unchanged from my initial visit note.  Past Medical History:  Diagnosis Date   ASCUS favor benign 2000   negative pap smears since   Constipation    Diverticulitis    HSV (herpes simplex virus) anogenital infection    IBS (irritable bowel syndrome)    Postmenopausal osteoporosis 12/2018   T score -2.5  Past Surgical History:  Procedure Laterality Date   APPENDECTOMY     COLON SURGERY  2012   Uterine polyp     Social History   Socioeconomic History   Marital status: Single    Spouse name: Not on file   Number of children: 1 - son 70 y/o in 02/2019 -she is soon to become a grandmother   Years of education: Not on file   Highest education level: Not on file  Occupational History   Not on file  Social Needs   Financial resource strain: Not on file   Food insecurity    Worry: Not on file    Inability: Not on file    Transportation needs    Medical: Not on file    Non-medical: Not on file  Tobacco Use   Smoking status: Former Smoker, quit in 1975   Smokeless tobacco: Never Used   Tobacco comment: QUIT 1974  Substance and Sexual Activity   Alcohol use: Yes- - 2 glasses 4-7 times a week       Drug use: No   Current Outpatient Medications on File Prior to Visit  Medication Sig Dispense Refill   Cholecalciferol (VITAMIN D PO) Take by mouth.     COVID-19 mRNA Vac-TriS, Pfizer, (PFIZER-BIONT COVID-19 VAC-TRIS) SUSP injection Inject into the muscle. 0.3 mL 0   Flaxseed, Linseed, (FLAX SEEDS PO) Take by mouth.     linaCLOtide (LINZESS PO) Take by mouth. (Patient not taking: Reported on 09/07/2020)     telmisartan (MICARDIS) 80 MG tablet Take 80 mg by mouth daily.     valACYclovir (VALTREX) 500 MG tablet Take 1 tablet (500 mg total) by mouth 2 (two) times daily. For 5 days with outbreaks 30 tablet 0   No current facility-administered medications on file prior to visit.   No Known Allergies Family History  Problem Relation Age of Onset   Heart attack Father    Lung disease Father    Cancer Maternal Grandmother        Uterine   Heart failure Sister   Mother died of perforated bowel at 93. Father died at 65.  PE: BP 128/72 (BP Location: Right Arm, Patient Position: Sitting, Cuff Size: Normal)   Pulse 72   Ht 5\' 3"  (1.6 m)   Wt 159 lb 6.4 oz (72.3 kg)   SpO2 98%   BMI 28.24 kg/m  Wt Readings from Last 3 Encounters:  02/23/21 159 lb 6.4 oz (72.3 kg)  09/07/20 154 lb (69.9 kg)  02/24/20 146 lb (66.2 kg)   Constitutional: normal weight, in NAD, no kyphosis Eyes: PERRLA, EOMI, no exophthalmos ENT: moist mucous membranes, no thyromegaly, no cervical lymphadenopathy Cardiovascular: RRR, No MRG Respiratory: CTA B Gastrointestinal: abdomen soft, NT, ND, BS+ Musculoskeletal: no deformities, strength intact in all 4 Skin: moist, warm, no rashes Neurological: no tremor with outstretched hands,  DTR normal in all 4  Assessment: 1. Osteoporosis  2.  Vitamin D insufficiency  Plan: 1. Osteoporosis -Likely postmenopausal/age-related and she also has a family history of osteoporosis in mother, who had a pubic fracture -On the latest bone density scan, her T-scores at the level of the right femoral neck were discrepant with a right total hip, but otherwise, her T-scores were stable.  We did discuss that the last bone density scan was an outlier and we are planning to repeat another DXA scan on the same machine [facility]) this year. -we discussed about the need to get 1000 to 1200  mg of calcium preferentially from the diet -She also continues vitamin D 2000 units daily -She is doing some weightbearing exercises but was not walking much this summer today increase humidity.  She did physical therapy, though. -She is trying to follow an alkaline diet.  She is not smoking or drinking more than 2 alcoholic drinks a day -Before last visit, she lost 25 pounds on weight watchers -since last visit, she gained some of this back -We did discuss at previous visit about different medication classes used for osteoporosis but we decided to wait until the new bone density returns to see if she needs to start medication.  If -Of note, she has a history of bone loss in her jaw so we have to be careful with ONJ but we did discuss that this happens usually with higher doses of medications used at more frequent intervals, for example in cancer patients.  Otherwise, the risk is quite low. -Latest BMP was reviewed and kidney function was normal in 10/2020.  Calcium was normal, at 9.8 in 07/2020.  We will not repeat this today. -I will see her back in 1 year  2.  Vitamin D insufficiency -Currently on 2000 units vitamin D daily, increased from 1000 units daily -At last check, vitamin D was in the normal range, 31.7 -We will recheck her vitamin D level today  Office Visit on 02/23/2021   Component Date Value Ref Range Status   VITD 02/23/2021 32.85  30.00 - 100.00 ng/mL Final  Normal vitamin D.  Philemon Kingdom, MD PhD [facility]

## 2021-02-24 DIAGNOSIS — K5730 Diverticulosis of large intestine without perforation or abscess without bleeding: Secondary | ICD-10-CM | POA: Diagnosis not present

## 2021-02-24 DIAGNOSIS — K219 Gastro-esophageal reflux disease without esophagitis: Secondary | ICD-10-CM | POA: Diagnosis not present

## 2021-02-24 DIAGNOSIS — K5900 Constipation, unspecified: Secondary | ICD-10-CM | POA: Diagnosis not present

## 2021-02-24 DIAGNOSIS — R1013 Epigastric pain: Secondary | ICD-10-CM | POA: Diagnosis not present

## 2021-02-25 DIAGNOSIS — R1013 Epigastric pain: Secondary | ICD-10-CM | POA: Diagnosis not present

## 2021-02-25 DIAGNOSIS — K219 Gastro-esophageal reflux disease without esophagitis: Secondary | ICD-10-CM | POA: Diagnosis not present

## 2021-03-02 ENCOUNTER — Other Ambulatory Visit: Payer: Self-pay

## 2021-03-02 ENCOUNTER — Ambulatory Visit: Payer: Medicare HMO

## 2021-03-02 DIAGNOSIS — M818 Other osteoporosis without current pathological fracture: Secondary | ICD-10-CM

## 2021-03-03 DIAGNOSIS — M5414 Radiculopathy, thoracic region: Secondary | ICD-10-CM | POA: Diagnosis not present

## 2021-03-03 DIAGNOSIS — M9901 Segmental and somatic dysfunction of cervical region: Secondary | ICD-10-CM | POA: Diagnosis not present

## 2021-03-03 DIAGNOSIS — M531 Cervicobrachial syndrome: Secondary | ICD-10-CM | POA: Diagnosis not present

## 2021-03-03 DIAGNOSIS — M9902 Segmental and somatic dysfunction of thoracic region: Secondary | ICD-10-CM | POA: Diagnosis not present

## 2021-03-15 ENCOUNTER — Other Ambulatory Visit (HOSPITAL_BASED_OUTPATIENT_CLINIC_OR_DEPARTMENT_OTHER): Payer: Self-pay

## 2021-03-15 ENCOUNTER — Ambulatory Visit: Payer: Medicare HMO | Attending: Internal Medicine

## 2021-03-15 DIAGNOSIS — Z23 Encounter for immunization: Secondary | ICD-10-CM

## 2021-03-15 MED ORDER — PFIZER COVID-19 VAC BIVALENT 30 MCG/0.3ML IM SUSP
INTRAMUSCULAR | 0 refills | Status: DC
  Filled 2021-03-15: qty 0.3, 1d supply, fill #0

## 2021-03-15 NOTE — Progress Notes (Signed)
   Covid-19 Vaccination Clinic  Name:  Tracy [name]    MRN: [id number] DOB: [birth year]  03/15/2021  Tracy [name] was observed post Covid-19 immunization for 15 minutes without incident. She was provided with Vaccine Information Sheet and instruction to access the V-Safe system.   Tracy [name] was instructed to call 911 with any severe reactions post vaccine: Difficulty breathing  Swelling of face and throat  A fast heartbeat  A bad rash all over body  Dizziness and weakness

## 2021-03-21 DIAGNOSIS — R140 Abdominal distension (gaseous): Secondary | ICD-10-CM | POA: Diagnosis not present

## 2021-03-27 DIAGNOSIS — Z03818 Encounter for observation for suspected exposure to other biological agents ruled out: Secondary | ICD-10-CM | POA: Diagnosis not present

## 2021-03-27 DIAGNOSIS — R509 Fever, unspecified: Secondary | ICD-10-CM | POA: Diagnosis not present

## 2021-03-27 DIAGNOSIS — J069 Acute upper respiratory infection, unspecified: Secondary | ICD-10-CM | POA: Diagnosis not present

## 2021-03-27 DIAGNOSIS — R0981 Nasal congestion: Secondary | ICD-10-CM | POA: Diagnosis not present

## 2021-04-01 DIAGNOSIS — J069 Acute upper respiratory infection, unspecified: Secondary | ICD-10-CM | POA: Diagnosis not present

## 2021-04-22 DIAGNOSIS — D2272 Melanocytic nevi of left lower limb, including hip: Secondary | ICD-10-CM | POA: Diagnosis not present

## 2021-04-22 DIAGNOSIS — L821 Other seborrheic keratosis: Secondary | ICD-10-CM | POA: Diagnosis not present

## 2021-04-22 DIAGNOSIS — Z419 Encounter for procedure for purposes other than remedying health state, unspecified: Secondary | ICD-10-CM | POA: Diagnosis not present

## 2021-04-22 DIAGNOSIS — L570 Actinic keratosis: Secondary | ICD-10-CM | POA: Diagnosis not present

## 2021-04-26 DIAGNOSIS — K5900 Constipation, unspecified: Secondary | ICD-10-CM | POA: Diagnosis not present

## 2021-04-26 DIAGNOSIS — K5732 Diverticulitis of large intestine without perforation or abscess without bleeding: Secondary | ICD-10-CM | POA: Diagnosis not present

## 2021-04-26 DIAGNOSIS — R140 Abdominal distension (gaseous): Secondary | ICD-10-CM | POA: Diagnosis not present

## 2021-04-26 DIAGNOSIS — K5730 Diverticulosis of large intestine without perforation or abscess without bleeding: Secondary | ICD-10-CM | POA: Diagnosis not present

## 2021-05-02 ENCOUNTER — Other Ambulatory Visit: Payer: Self-pay

## 2021-05-02 ENCOUNTER — Encounter: Payer: Self-pay | Admitting: Obstetrics & Gynecology

## 2021-05-02 ENCOUNTER — Ambulatory Visit: Payer: Medicare HMO | Admitting: Obstetrics & Gynecology

## 2021-05-02 VITALS — BP 112/78 | HR 73

## 2021-05-02 DIAGNOSIS — K5732 Diverticulitis of large intestine without perforation or abscess without bleeding: Secondary | ICD-10-CM | POA: Diagnosis not present

## 2021-05-02 DIAGNOSIS — Z780 Asymptomatic menopausal state: Secondary | ICD-10-CM

## 2021-05-02 DIAGNOSIS — K219 Gastro-esophageal reflux disease without esophagitis: Secondary | ICD-10-CM | POA: Diagnosis not present

## 2021-05-02 DIAGNOSIS — M810 Age-related osteoporosis without current pathological fracture: Secondary | ICD-10-CM | POA: Diagnosis not present

## 2021-05-02 LAB — CALCIUM: Calcium: 9.9 mg/dL (ref 8.6–10.4)

## 2021-05-02 NOTE — Progress Notes (Signed)
    Tracy [name] 04-04-1951 [id number]        70 y.o.  [id number]   RP: Counseling and management of Osteoporosis  HPI: Bone Density 03/15/2021 showing Osteoporosis at the Left Total Hip.  Osteopenia at all other sites, some close to Osteoporosis.  Significant Bone loss x 2020.  No regular weight bearing physical activities during the Covid pandemic.  Vitamin D on the low side of normal at 32 in 02/2021, increased her Vit D supplement to 2000 IU daily.  Ca++ intake probably not reaching 1.5 g/d.  Milk products constipate her and she already has a tendency for constipation.  H/O GERD with intolerance to Bisphosphonate.  Diverticulitis on Augmentin currently, visit with Dr Collene Mares scheduled.   OB History  Gravida Para Term Preterm AB Living  3 1     2 1   SAB IAB Ectopic Multiple Live Births  2            # Outcome Date GA Lbr Len/2nd Weight Sex Delivery Anes PTL Lv  3 SAB           2 SAB           1 Para             Past medical history,surgical history, problem list, medications, allergies, family history and social history were all reviewed and documented in the EPIC chart.   Directed ROS with pertinent positives and negatives documented in the history of present illness/assessment and plan.  Exam:  Vitals:   05/02/21 0804  BP: 112/78  Pulse: 73  SpO2: 98%   General appearance:  Normal  Bone Density 03/15/2021:  Osteoporosis Left Total Hip T-Score -2.6.  Other sites in Osteopenia range, some close to Osteoporosis.  Significant Bone loss at all sites x 2020.     Assessment/Plan:  70 y.o. [id number]   1. Age-related osteoporosis without current pathological fracture Bone Density 03/15/2021 showing Osteoporosis at the Left Total Hip.  Osteopenia at all other sites, some close to Osteoporosis.  Significant Bone loss x 2020.  No regular weight bearing physical activities during the Covid pandemic.  Vitamin D on the low side of normal at 32 in 02/2021, increased her Vit D supplement to  2000 IU daily.  Ca++ intake probably not reaching 1.5 g/d.  Milk products constipate her and she already has a tendency for constipation.  H/O GERD with intolerance to Bisphosphonate.  Diverticulitis on Augmentin currently, visit with Dr Collene Mares scheduled. Will check Ca++ level today.  Recommend to calculate PO intake of Ca++ and add a supplement as needed to reach 1.5 g/d.  Counseling done on the importance of regular weight bearing activities such as walking every day and light weights every 2 days.  Recommend starting on Bone therapy.  Best choice for her is Prolia given her intolerance to Bisphosphonate and her GI diseases.  Risks, especially necrosis of the jaw, and benefits of Prolia thoroughly reviewed.  Usage discussed with injections every 6 months. - Calcium  2. Postmenopause On no HRT.    3. Gastroesophageal reflux disease, unspecified whether esophagitis present Bisphosphonate not tolerated previously.  4. Diverticulitis of large intestine without perforation or abscess without bleeding Currently on Augmentin.  Visit scheduled with Dr Steve Rattler.  Other orders - Amoxicillin-Pot Clavulanate (AUGMENTIN PO); Take by mouth. [phone]   Princess Bruins MD, 8:13 AM 05/02/2021

## 2021-05-03 ENCOUNTER — Telehealth: Payer: Self-pay | Admitting: *Deleted

## 2021-05-03 NOTE — Telephone Encounter (Signed)
-----   Message from Princess Bruins, MD sent at 05/02/2021  8:36 AM EST ----- Regarding: Start on Prolia Osteoporosis T-Score -2.6 at the Left Total Hip.  Other sites in Osteopenia range, some close to Osteoporosis.  Significant Bone loss at all sites since 2020.  Severe GERD, Bisphosphonate not tolerated in the past.  Diverticulitis with previous bowel resection, absorption problems. Needs to start on Prolia.  Ca++ level done today.

## 2021-05-03 NOTE — Telephone Encounter (Signed)
Submitted to [place] for review of benefits.

## 2021-05-12 NOTE — Telephone Encounter (Signed)
PA filled out and faxed to [facility]. Fax #: [phone]. Will await response.

## 2021-05-17 NOTE — Telephone Encounter (Addendum)
   Annual exam: 09-07-20 TW  Calcium: 9.9        Date: 05-02-21  Upcoming dental procedures: No  Prior Authorization needed: yes, completed and authorized on 05-13-21.   Pt estimated Cost: $305        Coverage Details:

## 2021-05-17 NOTE — Telephone Encounter (Signed)
Patient returned call. Prolia summary of benefits reviewed with patient. Patient declines to schedule at this time due to [facility] cost and limited availability at this time of year. Patient asking to reevaluate in the new year. Will follow up with patient in January.   Routing to provider and will close encounter.

## 2021-05-17 NOTE — Telephone Encounter (Signed)
Message left to return call to Rayelle Armor at [phone].   

## 2021-05-25 ENCOUNTER — Ambulatory Visit
Admission: RE | Admit: 2021-05-25 | Discharge: 2021-05-25 | Disposition: A | Discharge status: Home / Self Care | Payer: Medicare HMO | Source: Ambulatory Visit | Attending: Physician Assistant | Admitting: Physician Assistant

## 2021-05-25 ENCOUNTER — Other Ambulatory Visit: Payer: Self-pay | Admitting: Physician Assistant

## 2021-05-25 DIAGNOSIS — M25562 Pain in left knee: Secondary | ICD-10-CM | POA: Diagnosis not present

## 2021-05-25 DIAGNOSIS — S8992XA Unspecified injury of left lower leg, initial encounter: Secondary | ICD-10-CM

## 2021-06-02 DIAGNOSIS — M25562 Pain in left knee: Secondary | ICD-10-CM | POA: Diagnosis not present

## 2021-06-07 DIAGNOSIS — Z79891 Long term (current) use of opiate analgesic: Secondary | ICD-10-CM | POA: Diagnosis not present

## 2021-06-07 DIAGNOSIS — R140 Abdominal distension (gaseous): Secondary | ICD-10-CM | POA: Diagnosis not present

## 2021-06-07 DIAGNOSIS — E663 Overweight: Secondary | ICD-10-CM | POA: Diagnosis not present

## 2021-06-07 DIAGNOSIS — F331 Major depressive disorder, recurrent, moderate: Secondary | ICD-10-CM | POA: Diagnosis not present

## 2021-06-07 DIAGNOSIS — R69 Illness, unspecified: Secondary | ICD-10-CM | POA: Diagnosis not present

## 2021-06-07 DIAGNOSIS — Z6829 Body mass index (BMI) 29.0-29.9, adult: Secondary | ICD-10-CM | POA: Diagnosis not present

## 2021-06-07 DIAGNOSIS — K5904 Chronic idiopathic constipation: Secondary | ICD-10-CM | POA: Diagnosis not present

## 2021-06-07 DIAGNOSIS — K581 Irritable bowel syndrome with constipation: Secondary | ICD-10-CM | POA: Diagnosis not present

## 2021-06-07 DIAGNOSIS — K5730 Diverticulosis of large intestine without perforation or abscess without bleeding: Secondary | ICD-10-CM | POA: Diagnosis not present

## 2021-06-08 DIAGNOSIS — Z87891 Personal history of nicotine dependence: Secondary | ICD-10-CM | POA: Diagnosis not present

## 2021-06-08 DIAGNOSIS — Z8249 Family history of ischemic heart disease and other diseases of the circulatory system: Secondary | ICD-10-CM | POA: Diagnosis not present

## 2021-06-08 DIAGNOSIS — E663 Overweight: Secondary | ICD-10-CM | POA: Diagnosis not present

## 2021-06-08 DIAGNOSIS — I10 Essential (primary) hypertension: Secondary | ICD-10-CM | POA: Diagnosis not present

## 2021-06-08 DIAGNOSIS — M810 Age-related osteoporosis without current pathological fracture: Secondary | ICD-10-CM | POA: Diagnosis not present

## 2021-06-08 DIAGNOSIS — Z6827 Body mass index (BMI) 27.0-27.9, adult: Secondary | ICD-10-CM | POA: Diagnosis not present

## 2021-06-08 DIAGNOSIS — Z882 Allergy status to sulfonamides status: Secondary | ICD-10-CM | POA: Diagnosis not present

## 2021-06-08 DIAGNOSIS — K5900 Constipation, unspecified: Secondary | ICD-10-CM | POA: Diagnosis not present

## 2021-06-08 DIAGNOSIS — K219 Gastro-esophageal reflux disease without esophagitis: Secondary | ICD-10-CM | POA: Diagnosis not present

## 2021-06-08 DIAGNOSIS — R32 Unspecified urinary incontinence: Secondary | ICD-10-CM | POA: Diagnosis not present

## 2021-06-14 ENCOUNTER — Telehealth: Payer: Self-pay | Admitting: *Deleted

## 2021-06-14 DIAGNOSIS — M810 Age-related osteoporosis without current pathological fracture: Secondary | ICD-10-CM

## 2021-06-14 NOTE — Telephone Encounter (Signed)
Deductible:  OOP MAX: $4500 ($25.00 met)   Annual exam: 09-07-20 ML  Calcium      9.9       Date 05-02-21  Upcoming dental procedures   Prior Authorization needed: yes on file (05-12-21 to 05-12-22).   Pt estimated Cost: $276 ( 20% OOP prolia cost) + $25 (20% admin fee) for a total of ~$301.         Coverage Details:

## 2021-06-14 NOTE — Telephone Encounter (Signed)
Call to patient. Patient states she fell and fractured her knee cap on 05-23-22. Has a follow up appointment with Dr. Norton Pastel, orthopedist, on 06-16-21. Would like to follow up with Dr. Norton Pastel in regards to prolia and fracture before making a decision to proceed. RN advised patient to have Dr. Norton Pastel send a copy of OV notes to our office. Patient agreeable. RN advised would follow up with patient the beginning of next week.

## 2021-06-16 DIAGNOSIS — M25562 Pain in left knee: Secondary | ICD-10-CM | POA: Diagnosis not present

## 2021-06-21 NOTE — Telephone Encounter (Signed)
Message left to return call to Kadeem Hyle at [phone].   

## 2021-06-28 DIAGNOSIS — Z8719 Personal history of other diseases of the digestive system: Secondary | ICD-10-CM | POA: Diagnosis not present

## 2021-06-28 DIAGNOSIS — K5909 Other constipation: Secondary | ICD-10-CM | POA: Diagnosis not present

## 2021-07-04 DIAGNOSIS — R69 Illness, unspecified: Secondary | ICD-10-CM | POA: Diagnosis not present

## 2021-07-04 DIAGNOSIS — F331 Major depressive disorder, recurrent, moderate: Secondary | ICD-10-CM | POA: Diagnosis not present

## 2021-07-05 ENCOUNTER — Other Ambulatory Visit: Payer: Self-pay

## 2021-07-05 ENCOUNTER — Telehealth: Payer: Self-pay

## 2021-07-05 DIAGNOSIS — K5900 Constipation, unspecified: Secondary | ICD-10-CM

## 2021-07-05 NOTE — Telephone Encounter (Signed)
Referral for anorectal manometry.  Provider Dr Nadeen Landau   Patient instructions for procedure mailed. Attempted to contact the patient. No answer. Left her a message with brief information.

## 2021-07-07 DIAGNOSIS — R69 Illness, unspecified: Secondary | ICD-10-CM | POA: Diagnosis not present

## 2021-07-07 DIAGNOSIS — F331 Major depressive disorder, recurrent, moderate: Secondary | ICD-10-CM | POA: Diagnosis not present

## 2021-07-11 DIAGNOSIS — K5909 Other constipation: Secondary | ICD-10-CM | POA: Diagnosis not present

## 2021-07-11 DIAGNOSIS — K5900 Constipation, unspecified: Secondary | ICD-10-CM | POA: Diagnosis not present

## 2021-07-12 DIAGNOSIS — Z79899 Other long term (current) drug therapy: Secondary | ICD-10-CM | POA: Diagnosis not present

## 2021-07-12 DIAGNOSIS — Z5181 Encounter for therapeutic drug level monitoring: Secondary | ICD-10-CM | POA: Diagnosis not present

## 2021-07-12 DIAGNOSIS — Z0000 Encounter for general adult medical examination without abnormal findings: Secondary | ICD-10-CM | POA: Diagnosis not present

## 2021-07-12 DIAGNOSIS — R5383 Other fatigue: Secondary | ICD-10-CM | POA: Diagnosis not present

## 2021-07-12 DIAGNOSIS — E538 Deficiency of other specified B group vitamins: Secondary | ICD-10-CM | POA: Diagnosis not present

## 2021-07-12 DIAGNOSIS — R69 Illness, unspecified: Secondary | ICD-10-CM | POA: Diagnosis not present

## 2021-07-12 DIAGNOSIS — K581 Irritable bowel syndrome with constipation: Secondary | ICD-10-CM | POA: Diagnosis not present

## 2021-07-12 DIAGNOSIS — Z1389 Encounter for screening for other disorder: Secondary | ICD-10-CM | POA: Diagnosis not present

## 2021-07-12 DIAGNOSIS — M810 Age-related osteoporosis without current pathological fracture: Secondary | ICD-10-CM | POA: Diagnosis not present

## 2021-07-12 DIAGNOSIS — I10 Essential (primary) hypertension: Secondary | ICD-10-CM | POA: Diagnosis not present

## 2021-07-18 DIAGNOSIS — R69 Illness, unspecified: Secondary | ICD-10-CM | POA: Diagnosis not present

## 2021-07-18 DIAGNOSIS — F331 Major depressive disorder, recurrent, moderate: Secondary | ICD-10-CM | POA: Diagnosis not present

## 2021-07-21 NOTE — Telephone Encounter (Signed)
Yes, it is fine to cancel Prolia and check benefits for Reclast to see if more affordable for patient.

## 2021-07-21 NOTE — Telephone Encounter (Signed)
Returned call to patient. Patient asking if reclast would be more affordable option versus prolia. RN advised would review with Tiffany and return call?  Okay to cancel prolia and verify benefits for reclast?

## 2021-07-28 NOTE — Telephone Encounter (Signed)
Call to [facility], spoke with [facility], call reference #: [id number]. Per rep, benefits for [id number] and [id number] reviewed, patient will owe 2-% co-insurance and admin fee is 100% covered. No PA required. Will update patient.

## 2021-07-28 NOTE — Telephone Encounter (Signed)
Call to patient. Patient advised of benefits as seen below. Patient agreeable. Patient advised will need lab work within 30 days of infusion. Patient scheduled for calcium and creatinine level to be drawn on 08-01-21. Patient advised once labs reviewed by provider and are normal can schedule infusion. Patient agreeable.   Will await lab results.

## 2021-07-28 NOTE — Telephone Encounter (Addendum)
Reclast instructions   Annual Exam (1 year) ? 09-07-20 TW  Called pt to verify insurance coverage / inform pt Reclast is in Process ? Yes, reference #: [id number]  Labs must be in 30 days window Serum Creatinine DATE? 08-01-21 0.70 Serum Calcium DATE? 08-01-21 9.8   PA needed?: No   Cost for Pt? 20% coinsurance, admin fee covered at 100%. Approximate cost for patient= $144  Order form filled out and faxed  w/MD sig?  Infusion will be done at ? [facility]  Pt aware? Detailed message left for patient per DPR with appointment date and time and reclast instructions. Advised will also receive appointment reminder through [place]. Return call to office with any questions/concerns.   Instructions mailed to pt? Yes on 08-04-21

## 2021-07-29 DIAGNOSIS — H524 Presbyopia: Secondary | ICD-10-CM | POA: Diagnosis not present

## 2021-07-29 DIAGNOSIS — H04123 Dry eye syndrome of bilateral lacrimal glands: Secondary | ICD-10-CM | POA: Diagnosis not present

## 2021-07-29 DIAGNOSIS — H35371 Puckering of macula, right eye: Secondary | ICD-10-CM | POA: Diagnosis not present

## 2021-07-30 ENCOUNTER — Emergency Department (HOSPITAL_COMMUNITY)
Admission: EM | Admit: 2021-07-30 | Discharge: 2021-07-30 | Disposition: A | Discharge status: Home / Self Care | Payer: Medicare HMO | Attending: Emergency Medicine | Admitting: Emergency Medicine

## 2021-07-30 ENCOUNTER — Other Ambulatory Visit: Payer: Self-pay

## 2021-07-30 ENCOUNTER — Encounter (HOSPITAL_COMMUNITY): Payer: Self-pay

## 2021-07-30 DIAGNOSIS — Z743 Need for continuous supervision: Secondary | ICD-10-CM | POA: Diagnosis not present

## 2021-07-30 DIAGNOSIS — I491 Atrial premature depolarization: Secondary | ICD-10-CM | POA: Diagnosis not present

## 2021-07-30 DIAGNOSIS — R002 Palpitations: Secondary | ICD-10-CM | POA: Insufficient documentation

## 2021-07-30 DIAGNOSIS — R55 Syncope and collapse: Secondary | ICD-10-CM | POA: Diagnosis not present

## 2021-07-30 DIAGNOSIS — R231 Pallor: Secondary | ICD-10-CM | POA: Insufficient documentation

## 2021-07-30 DIAGNOSIS — G4489 Other headache syndrome: Secondary | ICD-10-CM | POA: Diagnosis not present

## 2021-07-30 DIAGNOSIS — E871 Hypo-osmolality and hyponatremia: Secondary | ICD-10-CM | POA: Diagnosis not present

## 2021-07-30 DIAGNOSIS — R9431 Abnormal electrocardiogram [ECG] [EKG]: Secondary | ICD-10-CM | POA: Diagnosis not present

## 2021-07-30 LAB — BASIC METABOLIC PANEL
Anion gap: 5 (ref 5–15)
BUN: 10 mg/dL (ref 8–23)
CO2: 25 mmol/L (ref 22–32)
Calcium: 9.1 mg/dL (ref 8.9–10.3)
Chloride: 100 mmol/L (ref 98–111)
Creatinine, Ser: 0.74 mg/dL (ref 0.44–1.00)
GFR, Estimated: >60 mL/min (ref >60)
Glucose, Bld: 99 mg/dL (ref 70–99)
Potassium: 4.4 mmol/L (ref 3.5–5.1)
Sodium: 130 mmol/L — ABNORMAL LOW (ref 135–145)

## 2021-07-30 LAB — CBC
HCT: 35.8 % — ABNORMAL LOW (ref 36.0–46.0)
Hemoglobin: 12.1 g/dL (ref 12.0–15.0)
MCH: 30.6 pg (ref 26.0–34.0)
MCHC: 33.8 g/dL (ref 30.0–36.0)
MCV: 90.4 fL (ref 80.0–100.0)
Platelets: 219 10*3/uL (ref 150–400)
RBC: 3.96 MIL/uL (ref 3.87–5.11)
RDW: 13.5 % (ref 11.5–15.5)
WBC: 3.7 10*3/uL — ABNORMAL LOW (ref 4.0–10.5)
nRBC: 0 % (ref 0.0–0.2)

## 2021-07-30 LAB — TSH: TSH: 1.809 u[IU]/mL (ref 0.350–4.500)

## 2021-07-30 LAB — MAGNESIUM: Magnesium: 2.1 mg/dL (ref 1.7–2.4)

## 2021-07-30 NOTE — ED Provider Notes (Signed)
[facility] EMERGENCY DEPARTMENT Provider Note   CSN: [id number] Arrival date & time: 07/30/21  2458     History  Chief Complaint  Patient presents with   Palpitations    Tracy [name] is a 71 y.o. female.  HPI     71 year old female comes in with a chief complaint of palpitations, clammy hands, headache.  Her symptoms started at 5:30 AM, got her anxious and led her to calling 911. She indicates that she has noted that her BP has been running little high as well.  Patient started fluoxetine 20 mg about a month ago.  About couple of weeks ago she started noticing clammy hands, subsequently she started developing headaches.  When she checks her BP, the BP has been elevated.  Her PCP recently started her on amlodipine.  She has had palpitations in the past, however work-up was negative.  She denies any increased caffeine. Pt has no hx of PE, DVT and denies any exogenous hormone (testosterone / estrogen) use, long distance travels or surgery in the past 6 weeks, active cancer, recent immobilization.  There is no cancer history or unexplained weight loss.    Home Medications Prior to Admission medications   Medication Sig Start Date End Date Taking? Authorizing Provider  Amoxicillin-Pot Clavulanate (AUGMENTIN PO) Take by mouth. [phone]    [provider]  Cholecalciferol (VITAMIN D PO) Take 2,000 Int'l Units/day by mouth.    [provider]  Flaxseed, Linseed, (FLAX SEEDS PO) Take by mouth.    [provider]  telmisartan (MICARDIS) 80 MG tablet Take 80 mg by mouth daily. 07/11/19   [provider]  valACYclovir (VALTREX) 500 MG tablet Take 1 tablet (500 mg total) by mouth 2 (two) times daily. For 5 days with outbreaks 08/15/19   Huel Cote, NP      Allergies    Patient has no known allergies.    Review of Systems   Review of Systems  Constitutional:  Positive for activity change.  Cardiovascular:  Positive for  palpitations.  All other systems reviewed and are negative.  Physical Exam Updated Vital Signs BP (!) 131/105 (BP Location: Right Arm)    Pulse 69    Temp 97.9 F (36.6 C) (Oral)    Resp 18    Ht 5\' 3"  (1.6 m)    Wt 72.6 kg    SpO2 97%    BMI 28.34 kg/m  Physical Exam Vitals and nursing note reviewed.  Constitutional:      Appearance: She is well-developed.  HENT:     Head: Atraumatic.  Eyes:     Extraocular Movements: Extraocular movements intact.  Cardiovascular:     Rate and Rhythm: Normal rate.  Pulmonary:     Effort: Pulmonary effort is normal.  Musculoskeletal:     Cervical back: Normal range of motion and neck supple.  Skin:    General: Skin is warm and dry.  Neurological:     Mental Status: She is alert and oriented to person, place, and time.    ED Results / Procedures / Treatments   Labs (all labs ordered are listed, but only abnormal results are displayed) Labs Reviewed  BASIC METABOLIC PANEL - Abnormal; Notable for the following components:      Result Value   Sodium 130 (*)    All other components within normal limits  CBC - Abnormal; Notable for the following components:   WBC 3.7 (*)    HCT 35.8 (*)  All other components within normal limits  MAGNESIUM  TSH    EKG EKG Interpretation  Date/Time:  Saturday July 30 2021 07:32:55 EST Ventricular Rate:  72 PR Interval:  200 QRS Duration: 88 QT Interval:  389 QTC Calculation: 426 R Axis:   2 Text Interpretation: Sinus rhythm Low voltage, precordial leads No acute changes No significant change since last tracing Confirmed by Varney Biles [phone]) on 07/30/2021 9:53:07 AM  Radiology No results found.  Procedures Procedures    Medications Ordered in ED Medications - No data to display  ED Course/ Medical Decision Making/ A&P Clinical Course as of 07/30/21 1139  Sat Jul 30, 2021  1135 Sodium(!): 970 Metabolic profile reveals acute hyponatremia.  In the setting of patient starting  fluoxetine 30 days ago, my suspicion is that her hyponatremia is because of the new SSRI.  She indicates to me that she has been drinking extra fluid at the recommendation of her GI doctor.  My suspicion is that the hyponatremia is not because of dilution but SIADH secondary to her fluoxetine.  Advising patient to not take fluoxetine and to call her psychiatrist on Monday. [AN]  1136 Magnesium Magnesium, potassium are normal.  [AN]  1136 Hemoglobin: 12.1 No anemia. [AN]  1138 I further reviewed side effects of fluoxetine.  It indicates that patient can have clammy hands, headaches, hyponatremia and palpitations.  Given that all of her symptoms have begun after she started fluoxetine, my suspicion is high that she is having medication side effects.  We will advise discontinuation of the medication.  She is on a low-dose.  She has been made aware that discontinuing the medication can cause profound depression, if she starts having any SI or effects of major depression, she needs to call 911.  Patient in agreement with the plan. [AN]    Clinical Course User Index [AN] Varney Biles, MD                           Medical Decision Making Amount and/or Complexity of Data Reviewed Labs: ordered.   This patient presents to the ED with chief complaint(s) of clammy hands, palpitations, headache and nausea ongoing over a period of 2 to 3 weeks with pertinent past medical history of recent onset of fluoxetine, hypertension, hypertensive headaches, IBS which further complicates the presenting complaint. The complaint involves an extensive differential diagnosis and treatment options and also carries with it a high risk of complications and morbidity.    The differential diagnosis includes : Arrhythmias such as PAC, PSVT, A-fib flutter.  Also differential includes electrolyte abnormalities, medication side effect, hyperthyroidism, endocrine issues/metabolic issues.  The initial plan is to get basic labs,  place patient on cardiac monitoring and reassess.   Additional history obtained: Records reviewed Primary Care Documents -evaluated recent visit with Dr. Laurann Montana, PCP.   Reviewed patient's telemetry/cardiac monitoring results.  Patient was placed on telemetry because of her complaints of palpitations.  During her ED stay, patient is noted to be in sinus rhythm without any evidence of dysrhythmia.    Final Clinical Impression(s) / ED Diagnoses Final diagnoses:  Palpitations  Acute hyponatremia    Rx / DC Orders ED Discharge Orders     None         Varney Biles, MD 07/30/21 1139

## 2021-07-30 NOTE — ED Triage Notes (Signed)
Pt bib [facility] from home c/o heart palpations + clammy hands, headache and nausea upon waking around 5:30. Hx hypertension, reports headache for the past week with elevated pressures. Pt started on amlodipine 4 days ago. Pt axo x4. 20G LAC PTA. BP 164/86. HR 84, CBG 102, O2 97% RA.

## 2021-07-30 NOTE — Discharge Instructions (Signed)
You are seen in the ER for headaches, palpitation, clamminess.  As indicated, fluoxetine is known to cause all of those symptoms -in our work-up reveals that in addition you have low sodium which also could be attributed to fluoxetine.  At this time, we strongly urged that he discontinue fluoxetine and have your blood work repeated again to ensure that your sodium is better.  Please read the instructions on hyponatremia, return to the ER if your symptoms are getting worse. Call your psychiatrist on Monday.  Call your primary care doctor also on Monday for an appointment in 7 to 10 days.

## 2021-08-01 ENCOUNTER — Encounter: Payer: Self-pay | Admitting: Nurse Practitioner

## 2021-08-01 ENCOUNTER — Other Ambulatory Visit: Payer: Medicare HMO

## 2021-08-01 ENCOUNTER — Other Ambulatory Visit: Payer: Self-pay

## 2021-08-01 DIAGNOSIS — M810 Age-related osteoporosis without current pathological fracture: Secondary | ICD-10-CM

## 2021-08-01 LAB — CREATININE, SERUM: Creat: 0.7 mg/dL (ref 0.60–1.00)

## 2021-08-01 LAB — CALCIUM: Calcium: 9.8 mg/dL (ref 8.6–10.4)

## 2021-08-01 NOTE — Telephone Encounter (Signed)
Please see if BMP can be added to her lab work from this morning. Thanks.

## 2021-08-01 NOTE — Telephone Encounter (Signed)
Courtney informed regarding add on BMP.

## 2021-08-02 LAB — BASIC METABOLIC PANEL WITH GFR
BUN: 15 mg/dL (ref 7–25)
CO2: 21 mmol/L (ref 20–32)
Calcium: 9.8 mg/dL (ref 8.6–10.4)
Chloride: 100 mmol/L (ref 98–110)
Creat: 0.7 mg/dL (ref 0.60–1.00)
Glucose, Bld: 95 mg/dL (ref 65–99)
Potassium: 4.9 mmol/L (ref 3.5–5.3)
Sodium: 133 mmol/L — ABNORMAL LOW (ref 135–146)
eGFR: 92 mL/min/{1.73_m2} (ref >=60)

## 2021-08-04 DIAGNOSIS — R0781 Pleurodynia: Secondary | ICD-10-CM | POA: Diagnosis not present

## 2021-08-04 DIAGNOSIS — R69 Illness, unspecified: Secondary | ICD-10-CM | POA: Diagnosis not present

## 2021-08-04 DIAGNOSIS — F331 Major depressive disorder, recurrent, moderate: Secondary | ICD-10-CM | POA: Diagnosis not present

## 2021-08-04 NOTE — Telephone Encounter (Signed)
Labs within normal limits. Reclast order signed by Jonelle Sidle, NP and faxed to [place] at [facility] Short Stay. Fax #: [phone].  ?

## 2021-08-04 NOTE — Telephone Encounter (Signed)
No it should not interfere but I do ask that she have the report sent to [place] for review. Thanks.  ?

## 2021-08-04 NOTE — Telephone Encounter (Signed)
Advised patient of message as seen below from [place], NP. Patient states she will ask Dr. Zackery Barefoot office to fax a copy of X-Ray report and OV notes to our office.  ?

## 2021-08-04 NOTE — Telephone Encounter (Signed)
Patient returned call states she is unable to keep appointment on 08-16-21 due to scheduling conflict. RN called [facility] Short Stay. Appointment changed to 08-26-21 at 1000. Patient agreeable to date and time of appointment.  ? ?Patient states she thinks she broke her rib yesterday leaning over a grocery cart because she heard a pop. States she is seeing Orthopedist, Dr. Rip Harbour, this afternoon for X-Ray. Asking if this will interfere at all with reclast infusion? RN advised would review with Tiffany, NP and return call. Patient agreeable.  ?

## 2021-08-10 DIAGNOSIS — F331 Major depressive disorder, recurrent, moderate: Secondary | ICD-10-CM | POA: Diagnosis not present

## 2021-08-10 DIAGNOSIS — F419 Anxiety disorder, unspecified: Secondary | ICD-10-CM | POA: Diagnosis not present

## 2021-08-10 DIAGNOSIS — R69 Illness, unspecified: Secondary | ICD-10-CM | POA: Diagnosis not present

## 2021-08-16 ENCOUNTER — Ambulatory Visit (HOSPITAL_COMMUNITY): Payer: Medicare HMO

## 2021-08-26 ENCOUNTER — Ambulatory Visit (HOSPITAL_COMMUNITY)
Admission: RE | Admit: 2021-08-26 | Discharge: 2021-08-26 | Disposition: A | Discharge status: Home / Self Care | Payer: Medicare HMO | Source: Ambulatory Visit | Attending: Nurse Practitioner | Admitting: Nurse Practitioner

## 2021-08-26 ENCOUNTER — Other Ambulatory Visit: Payer: Self-pay

## 2021-08-26 DIAGNOSIS — M810 Age-related osteoporosis without current pathological fracture: Secondary | ICD-10-CM | POA: Diagnosis not present

## 2021-08-26 MED ORDER — ZOLEDRONIC ACID 5 MG/100ML IV SOLN
INTRAVENOUS | Status: AC
  Filled 2021-08-26: qty 100

## 2021-08-26 MED ORDER — ZOLEDRONIC ACID 5 MG/100ML IV SOLN
5.0000 mg | Freq: Once | INTRAVENOUS | Status: AC
  Administered 2021-08-26: 5 mg via INTRAVENOUS

## 2021-09-08 ENCOUNTER — Ambulatory Visit: Payer: Medicare HMO | Admitting: Nurse Practitioner

## 2021-09-12 ENCOUNTER — Ambulatory Visit (INDEPENDENT_AMBULATORY_CARE_PROVIDER_SITE_OTHER): Payer: Medicare HMO | Admitting: Nurse Practitioner

## 2021-09-12 ENCOUNTER — Encounter: Payer: Self-pay | Admitting: Nurse Practitioner

## 2021-09-12 VITALS — BP 118/80 | Ht 62.0 in | Wt 159.0 lb

## 2021-09-12 DIAGNOSIS — R69 Illness, unspecified: Secondary | ICD-10-CM | POA: Diagnosis not present

## 2021-09-12 DIAGNOSIS — M810 Age-related osteoporosis without current pathological fracture: Secondary | ICD-10-CM

## 2021-09-12 DIAGNOSIS — Z9189 Other specified personal risk factors, not elsewhere classified: Secondary | ICD-10-CM

## 2021-09-12 DIAGNOSIS — A609 Anogenital herpesviral infection, unspecified: Secondary | ICD-10-CM

## 2021-09-12 DIAGNOSIS — Z01419 Encounter for gynecological examination (general) (routine) without abnormal findings: Secondary | ICD-10-CM

## 2021-09-12 MED ORDER — VALACYCLOVIR HCL 500 MG PO TABS: 500.0000 mg | ORAL_TABLET | Freq: Two times a day (BID) | ORAL | 2 refills | Status: DC

## 2021-09-12 NOTE — Progress Notes (Signed)
? ?Tracy [name] May 27, 1951 [id number] ? ? ?History:  71 y.o. [id number] presents for breast and pelvic exam.  Postmenopausal - no HRT, no bleeding. Normal pap and mammogram history. Osteoporosis - had first Reclast infusion 08/26/2021. Starting 1 week after infusion she began having generalized joint pain, to the point it keeps her up at night. She also feels fatigued. History of IBC-C managed by GI. Did pelvic floor PT last year due to inability to empty bladder and has done well since.  ? ?Gynecologic History ?No LMP recorded. Patient is postmenopausal. ?  ?Contraception: post menopausal status ?Sexually active: No ? ?Health Maintenance ?Last Pap: 08/26/2018. Results were: Normal ?Last mammogram: 01/05/2021. Results were: Normal ?Last colonoscopy: 01/15/2017. Results were: Normal, 10-year recall ?Last Dexa: 03/02/2021. Results were: T-score -2.6 in left hip, all other sites osteopenic ? ?Past medical history, past surgical history, family history and social history were all reviewed and documented in the EPIC chart. 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 3 grandchildren under 5.   ? ?ROS:  A ROS was performed and pertinent positives and negatives are included. ? ?Exam: ? ?Vitals:  ? 09/12/21 1032  ?BP: 118/80  ?Weight: 159 lb (72.1 kg)  ?Height: '5\' 2"'$  (1.575 m)  ? ? ?Body mass index is 29.08 kg/m?. ? ?General appearance:  Normal ?Thyroid:  Symmetrical, normal in size, without palpable masses or nodularity. ?Respiratory ? Auscultation:  Clear without wheezing or rhonchi ?Cardiovascular ? Auscultation:  Regular rate, without rubs, murmurs or gallops ? Edema/varicosities:  Not grossly evident ?Abdominal ? Soft,nontender, without masses, guarding or rebound. ? Liver/spleen:  No organomegaly noted ? Hernia:  None appreciated ? Skin ? Inspection:  Grossly normal ?  ?Breasts: Examined lying and sitting.  ? Right: Without masses, retractions, discharge or axillary adenopathy. ? ? Left: Without  masses, retractions, discharge or axillary adenopathy. ?Gentitourinary  ? Inguinal/mons:  Normal without inguinal adenopathy ? External genitalia:  Normal ? BUS/Urethra/Skene's glands:  Normal ? Vagina:  Mild cystocele, atrophy present ? Cervix:  Normal ? Uterus:  Normal in size, shape and contour.  Midline and mobile ? Adnexa/parametria:   ?  Rt: Without masses or tenderness. ?  Lt: Without masses or tenderness. ? Anus and perineum: Normal ? Digital rectal exam: Normal sphincter tone without palpated masses or tenderness ? ?Patient informed chaperone available to be present for breast and pelvic exam. Patient has requested no chaperone to be present. Patient has been advised what will be completed during breast and pelvic exam.  ? ? ?Assessment/Plan:  71 y.o. [id number] for breast and pelvic exam.  ? ?Well female exam with routine gynecological exam - Education provided on SBEs, importance of preventative screenings, current guidelines, high calcium diet, regular exercise, and multivitamin daily. Labs done elsewhere.  ? ?Age-related osteoporosis without current pathological fracture - T-score -2.6 in September 2022. had first Reclast infusion 08/26/2021. Starting 1 week after infusion she began having generalized joint pain, to the point it keeps her up at night. She also feels fatigued. Recommend Tylenol as needed for pain. She understandably does not want infusion again. Is considering Prolia.  ? ?HSV (herpes simplex virus) anogenital infection - Plan: valACYclovir (VALTREX) 500 MG tablet as needed. Had outbreak in March for first time in a long time.  ? ?Screening for cervical cancer - Normal Pap history.  No longer screening per guidelines. ? ?Screening for breast cancer - Normal mammogram history.  Continue annual screenings.  Normal breast exam today. ? ?  Screening for colon cancer - 2018 colonoscopy. Will repeat at GI's recommended interval.  ? ?Return in 1 year for annual.  ? ? ? ? ? ?Tamela Gammon DNP,  11:06 AM 09/12/2021 ? ?

## 2021-09-23 DIAGNOSIS — H04123 Dry eye syndrome of bilateral lacrimal glands: Secondary | ICD-10-CM | POA: Diagnosis not present

## 2021-09-26 DIAGNOSIS — E538 Deficiency of other specified B group vitamins: Secondary | ICD-10-CM | POA: Diagnosis not present

## 2021-09-30 DIAGNOSIS — I10 Essential (primary) hypertension: Secondary | ICD-10-CM | POA: Diagnosis not present

## 2021-09-30 DIAGNOSIS — R69 Illness, unspecified: Secondary | ICD-10-CM | POA: Diagnosis not present

## 2021-09-30 DIAGNOSIS — K581 Irritable bowel syndrome with constipation: Secondary | ICD-10-CM | POA: Diagnosis not present

## 2021-09-30 DIAGNOSIS — M810 Age-related osteoporosis without current pathological fracture: Secondary | ICD-10-CM | POA: Diagnosis not present

## 2021-10-03 ENCOUNTER — Encounter (HOSPITAL_COMMUNITY): Payer: Self-pay | Admitting: Gastroenterology

## 2021-10-05 ENCOUNTER — Encounter (HOSPITAL_COMMUNITY)
Admission: RE | Disposition: A | Discharge status: Home / Self Care | Payer: Self-pay | Source: Home / Self Care | Attending: Gastroenterology

## 2021-10-05 ENCOUNTER — Encounter (HOSPITAL_COMMUNITY): Payer: Self-pay | Admitting: Gastroenterology

## 2021-10-05 ENCOUNTER — Ambulatory Visit (HOSPITAL_COMMUNITY)
Admission: RE | Admit: 2021-10-05 | Discharge: 2021-10-05 | Disposition: A | Discharge status: Home / Self Care | Payer: Medicare HMO | Attending: Gastroenterology | Admitting: Gastroenterology

## 2021-10-05 DIAGNOSIS — K5902 Outlet dysfunction constipation: Secondary | ICD-10-CM

## 2021-10-05 DIAGNOSIS — K5900 Constipation, unspecified: Secondary | ICD-10-CM | POA: Diagnosis not present

## 2021-10-05 DIAGNOSIS — Z538 Procedure and treatment not carried out for other reasons: Secondary | ICD-10-CM | POA: Diagnosis not present

## 2021-10-05 HISTORY — PX: ANAL RECTAL MANOMETRY: SHX6358

## 2021-10-05 SURGERY — MANOMETRY, ANORECTAL
Anesthesia: Choice

## 2021-10-05 NOTE — Progress Notes (Signed)
Anorectal manometry performed per protocol with no pain or problem.  50cc expulsion balloon placed and removed at 30 seconds because patient stated it was to painful.  Report to be sent to Dr Harl Bowie. ?

## 2021-10-07 ENCOUNTER — Encounter (HOSPITAL_COMMUNITY): Payer: Self-pay | Admitting: Gastroenterology

## 2021-10-11 ENCOUNTER — Other Ambulatory Visit: Payer: Self-pay | Admitting: Internal Medicine

## 2021-10-11 ENCOUNTER — Ambulatory Visit
Admission: RE | Admit: 2021-10-11 | Discharge: 2021-10-11 | Disposition: A | Discharge status: Home / Self Care | Payer: Medicare HMO | Source: Ambulatory Visit | Attending: Internal Medicine | Admitting: Internal Medicine

## 2021-10-11 DIAGNOSIS — M79641 Pain in right hand: Secondary | ICD-10-CM | POA: Diagnosis not present

## 2021-10-11 DIAGNOSIS — M79642 Pain in left hand: Secondary | ICD-10-CM | POA: Diagnosis not present

## 2021-10-11 DIAGNOSIS — M25561 Pain in right knee: Secondary | ICD-10-CM | POA: Diagnosis not present

## 2021-10-11 DIAGNOSIS — R059 Cough, unspecified: Secondary | ICD-10-CM | POA: Diagnosis not present

## 2021-10-11 DIAGNOSIS — M25541 Pain in joints of right hand: Secondary | ICD-10-CM | POA: Diagnosis not present

## 2021-10-11 DIAGNOSIS — R5383 Other fatigue: Secondary | ICD-10-CM | POA: Diagnosis not present

## 2021-10-11 DIAGNOSIS — M25562 Pain in left knee: Secondary | ICD-10-CM | POA: Diagnosis not present

## 2021-10-11 DIAGNOSIS — M2550 Pain in unspecified joint: Secondary | ICD-10-CM | POA: Diagnosis not present

## 2021-10-11 DIAGNOSIS — M25542 Pain in joints of left hand: Secondary | ICD-10-CM | POA: Diagnosis not present

## 2021-10-14 DIAGNOSIS — L814 Other melanin hyperpigmentation: Secondary | ICD-10-CM | POA: Diagnosis not present

## 2021-10-14 DIAGNOSIS — D2272 Melanocytic nevi of left lower limb, including hip: Secondary | ICD-10-CM | POA: Diagnosis not present

## 2021-10-14 DIAGNOSIS — D2271 Melanocytic nevi of right lower limb, including hip: Secondary | ICD-10-CM | POA: Diagnosis not present

## 2021-10-14 DIAGNOSIS — D2261 Melanocytic nevi of right upper limb, including shoulder: Secondary | ICD-10-CM | POA: Diagnosis not present

## 2021-10-14 DIAGNOSIS — L821 Other seborrheic keratosis: Secondary | ICD-10-CM | POA: Diagnosis not present

## 2021-10-14 DIAGNOSIS — D2262 Melanocytic nevi of left upper limb, including shoulder: Secondary | ICD-10-CM | POA: Diagnosis not present

## 2021-10-14 DIAGNOSIS — L820 Inflamed seborrheic keratosis: Secondary | ICD-10-CM | POA: Diagnosis not present

## 2021-10-14 DIAGNOSIS — D225 Melanocytic nevi of trunk: Secondary | ICD-10-CM | POA: Diagnosis not present

## 2021-10-14 DIAGNOSIS — D1801 Hemangioma of skin and subcutaneous tissue: Secondary | ICD-10-CM | POA: Diagnosis not present

## 2021-10-20 DIAGNOSIS — K581 Irritable bowel syndrome with constipation: Secondary | ICD-10-CM | POA: Diagnosis not present

## 2021-10-20 DIAGNOSIS — R69 Illness, unspecified: Secondary | ICD-10-CM | POA: Diagnosis not present

## 2021-10-20 DIAGNOSIS — M810 Age-related osteoporosis without current pathological fracture: Secondary | ICD-10-CM | POA: Diagnosis not present

## 2021-10-20 DIAGNOSIS — I10 Essential (primary) hypertension: Secondary | ICD-10-CM | POA: Diagnosis not present

## 2021-11-07 ENCOUNTER — Telehealth: Payer: Self-pay

## 2021-11-07 DIAGNOSIS — R5383 Other fatigue: Secondary | ICD-10-CM | POA: Diagnosis not present

## 2021-11-07 DIAGNOSIS — I10 Essential (primary) hypertension: Secondary | ICD-10-CM | POA: Diagnosis not present

## 2021-11-07 DIAGNOSIS — M159 Polyosteoarthritis, unspecified: Secondary | ICD-10-CM | POA: Diagnosis not present

## 2021-11-07 DIAGNOSIS — R059 Cough, unspecified: Secondary | ICD-10-CM | POA: Diagnosis not present

## 2021-11-07 NOTE — Telephone Encounter (Signed)
Anorectal  manometry report faxed to Dr Nadeen Landau at [facility].

## 2021-11-08 DIAGNOSIS — M810 Age-related osteoporosis without current pathological fracture: Secondary | ICD-10-CM | POA: Diagnosis not present

## 2021-11-08 DIAGNOSIS — M2550 Pain in unspecified joint: Secondary | ICD-10-CM | POA: Diagnosis not present

## 2021-11-08 DIAGNOSIS — M1990 Unspecified osteoarthritis, unspecified site: Secondary | ICD-10-CM | POA: Diagnosis not present

## 2021-11-08 DIAGNOSIS — M79606 Pain in leg, unspecified: Secondary | ICD-10-CM | POA: Diagnosis not present

## 2021-11-10 DIAGNOSIS — K5902 Outlet dysfunction constipation: Secondary | ICD-10-CM

## 2021-11-10 DIAGNOSIS — G4710 Hypersomnia, unspecified: Secondary | ICD-10-CM | POA: Diagnosis not present

## 2021-11-10 DIAGNOSIS — G4719 Other hypersomnia: Secondary | ICD-10-CM | POA: Diagnosis not present

## 2021-12-08 ENCOUNTER — Other Ambulatory Visit (HOSPITAL_BASED_OUTPATIENT_CLINIC_OR_DEPARTMENT_OTHER): Payer: Self-pay

## 2021-12-08 ENCOUNTER — Ambulatory Visit: Payer: Medicare HMO | Attending: Internal Medicine

## 2021-12-08 DIAGNOSIS — Z23 Encounter for immunization: Secondary | ICD-10-CM

## 2021-12-08 MED ORDER — PFIZER COVID-19 VAC BIVALENT 30 MCG/0.3ML IM SUSP
INTRAMUSCULAR | 0 refills | Status: DC
  Filled 2021-12-08: qty 0.3, 1d supply, fill #0

## 2021-12-08 MED ORDER — ZOSTER VAC RECOMB ADJUVANTED 50 MCG/0.5ML IM SUSR
INTRAMUSCULAR | 1 refills | Status: DC
  Filled 2021-12-08: qty 0.5, 1d supply, fill #0

## 2021-12-08 NOTE — Progress Notes (Signed)
   Covid-19 Vaccination Clinic  Name:  Tracy [name]    MRN: [id number] DOB: 1950/07/17  12/08/2021  [name] was observed post Covid-19 immunization for 15 minutes without incident. She was provided with Vaccine Information Sheet and instruction to access the V-Safe system.   [name] was instructed to call 911 with any severe reactions post vaccine: Difficulty breathing  Swelling of face and throat  A fast heartbeat  A bad rash all over body  Dizziness and weakness   Immunizations Administered     Name Date Dose VIS Date Route   Pfizer Covid-19 Vaccine Bivalent Booster 12/08/2021  1:18 PM 0.3 mL 02/02/2021 Intramuscular   Manufacturer: [place]   Lot: [id number]   [place]: [phone]

## 2021-12-26 DIAGNOSIS — M810 Age-related osteoporosis without current pathological fracture: Secondary | ICD-10-CM | POA: Diagnosis not present

## 2021-12-26 DIAGNOSIS — I10 Essential (primary) hypertension: Secondary | ICD-10-CM | POA: Diagnosis not present

## 2021-12-26 DIAGNOSIS — R69 Illness, unspecified: Secondary | ICD-10-CM | POA: Diagnosis not present

## 2022-01-10 DIAGNOSIS — Z1231 Encounter for screening mammogram for malignant neoplasm of breast: Secondary | ICD-10-CM | POA: Diagnosis not present

## 2022-01-12 ENCOUNTER — Encounter: Payer: Self-pay | Admitting: Obstetrics & Gynecology

## 2022-01-19 DIAGNOSIS — R69 Illness, unspecified: Secondary | ICD-10-CM | POA: Diagnosis not present

## 2022-01-19 DIAGNOSIS — I10 Essential (primary) hypertension: Secondary | ICD-10-CM | POA: Diagnosis not present

## 2022-01-19 DIAGNOSIS — M810 Age-related osteoporosis without current pathological fracture: Secondary | ICD-10-CM | POA: Diagnosis not present

## 2022-01-19 DIAGNOSIS — K581 Irritable bowel syndrome with constipation: Secondary | ICD-10-CM | POA: Diagnosis not present

## 2022-02-24 ENCOUNTER — Ambulatory Visit: Payer: Medicare HMO | Admitting: Internal Medicine

## 2022-05-05 IMAGING — CT CT RENAL STONE PROTOCOL
2 of 4 series · 16 of 46 positions shown, 18 images · non-contrast
Comparison: June 11, 2017

CLINICAL DATA: Right lower quadrant abdominal pain

EXAM:
CT ABDOMEN AND PELVIS WITHOUT CONTRAST
TECHNIQUE: Multidetector CT imaging of the abdomen and pelvis was performed
following the standard protocol without IV contrast.

[Series 2: axial st · axial · 0.64mm/px · z∈[+1219,+1559]mm · 13 of 77 slices shown, 15 images]
[im 5/77  soft-tissue]
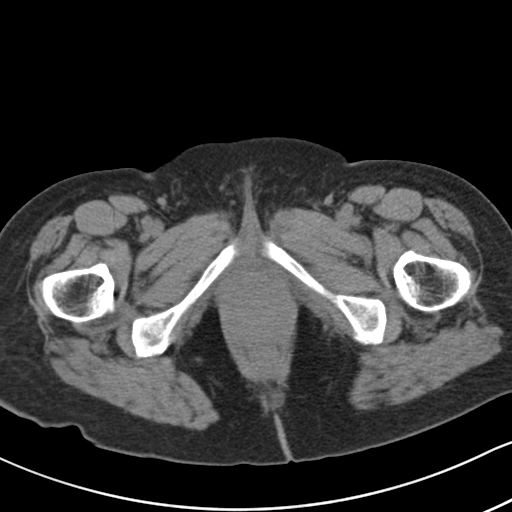
[im 5/77  bone]
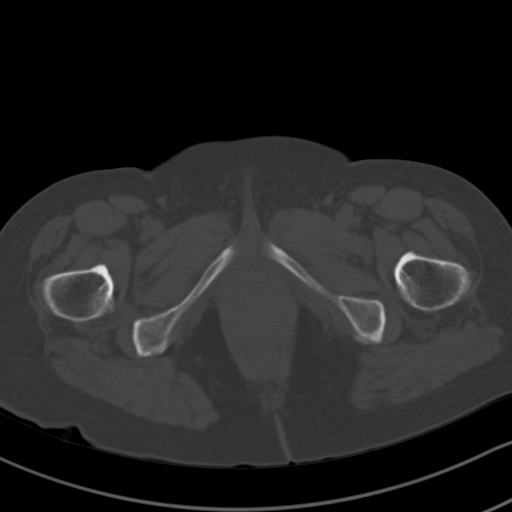
[im 13/77  soft-tissue]
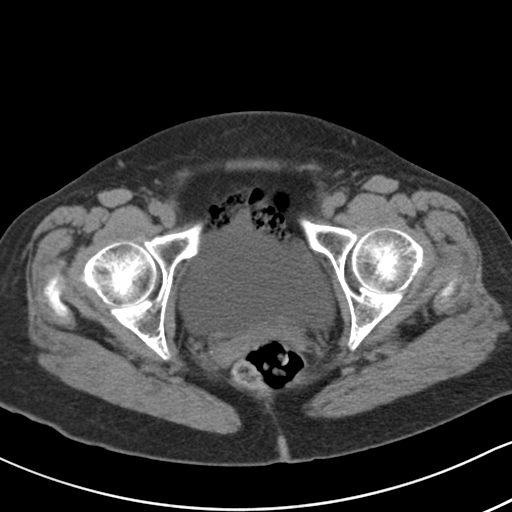
[im 17/77  soft-tissue]
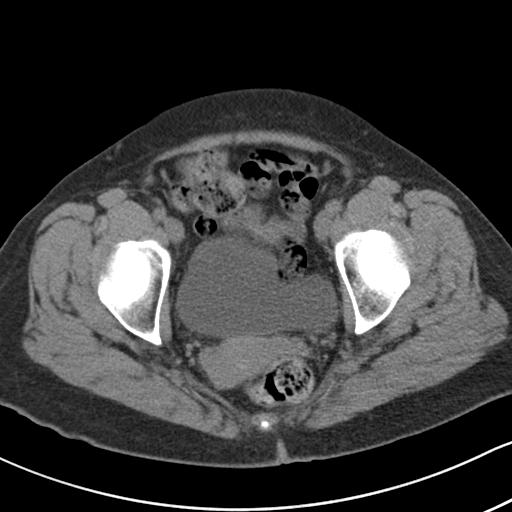
[im 21/77  soft-tissue]
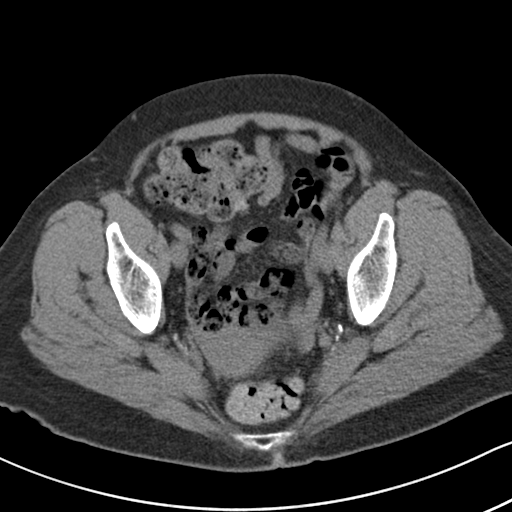
[im 29/77  soft-tissue]
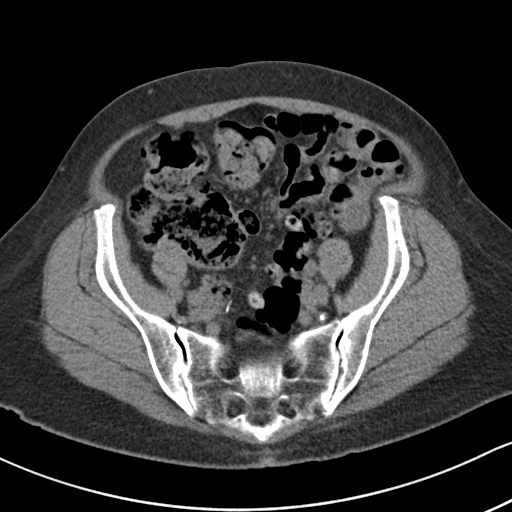
[im 33/77  soft-tissue]
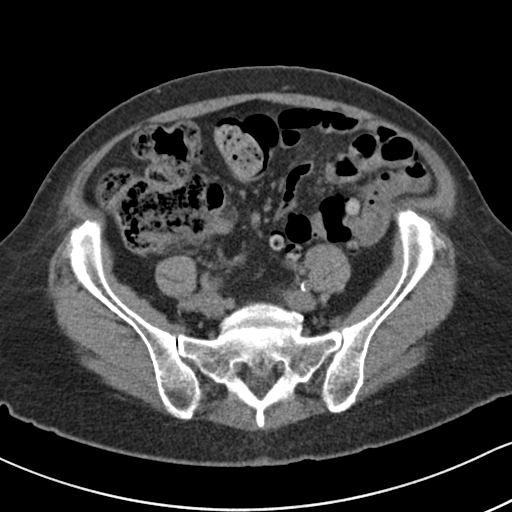
[im 41/77  soft-tissue]
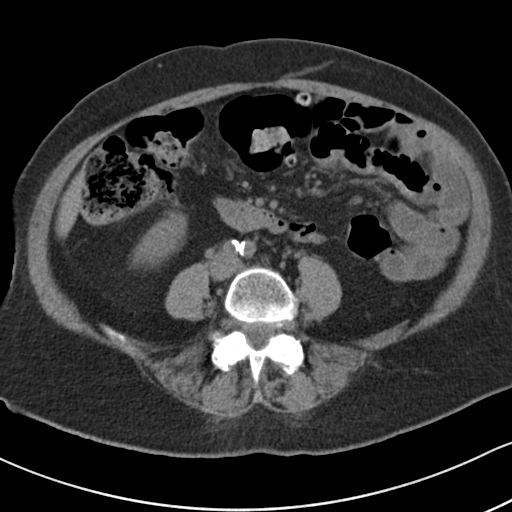
[im 45/77  soft-tissue]
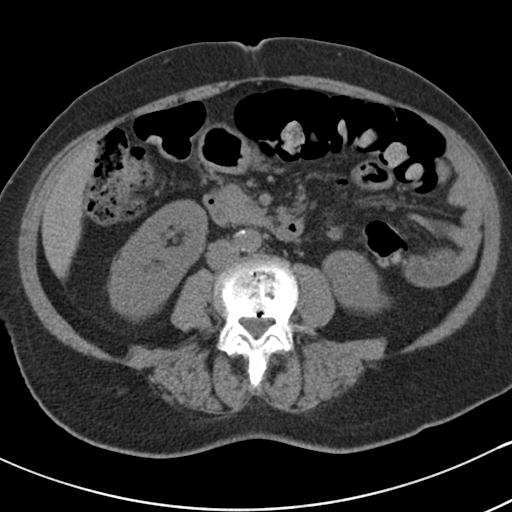
[im 49/77  soft-tissue]
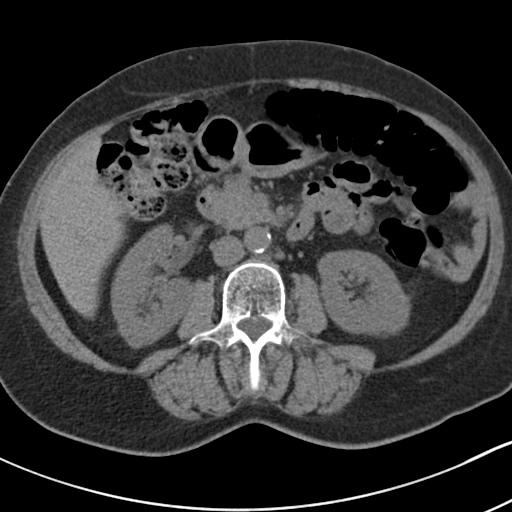
[im 49/77  bone]
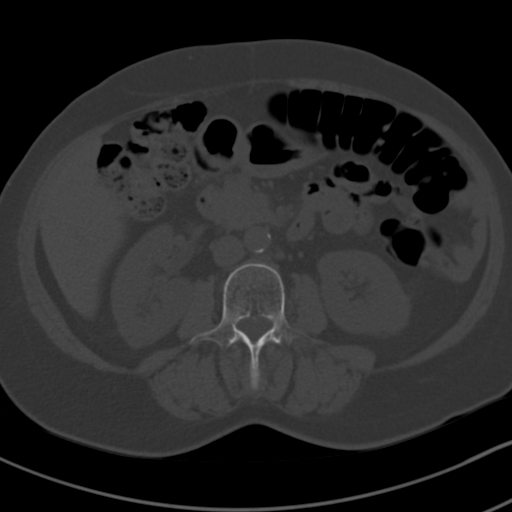
[im 57/77  soft-tissue]
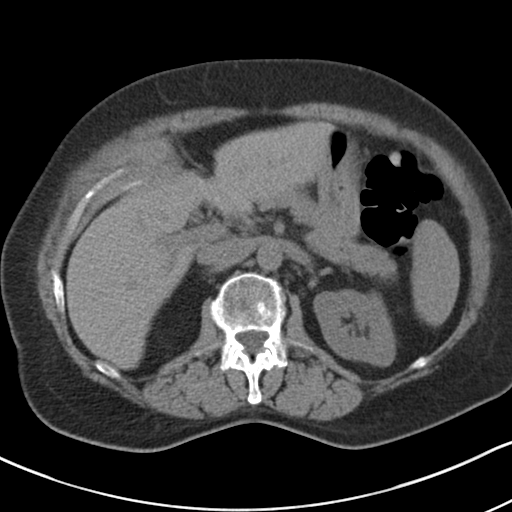
[im 61/77  soft-tissue]
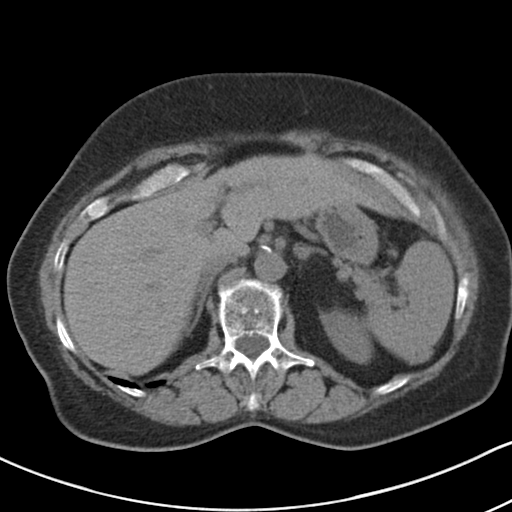
[im 65/77  soft-tissue]
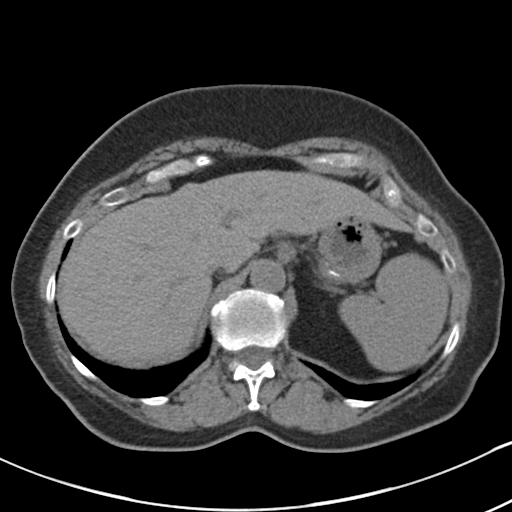
[im 73/77  soft-tissue]
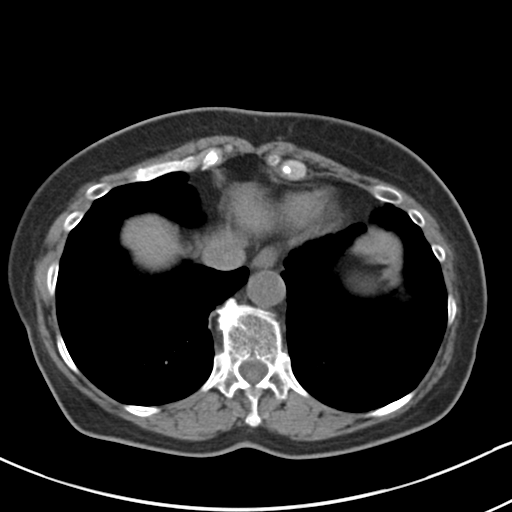

[Series 4: coronal · coronal · 0.68mm/px · 3 of 135 slices shown]
[im 45/135  soft-tissue]
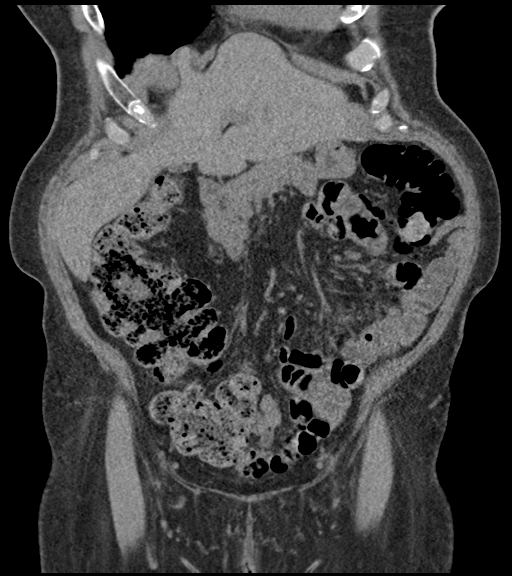
[im 60/135  soft-tissue]
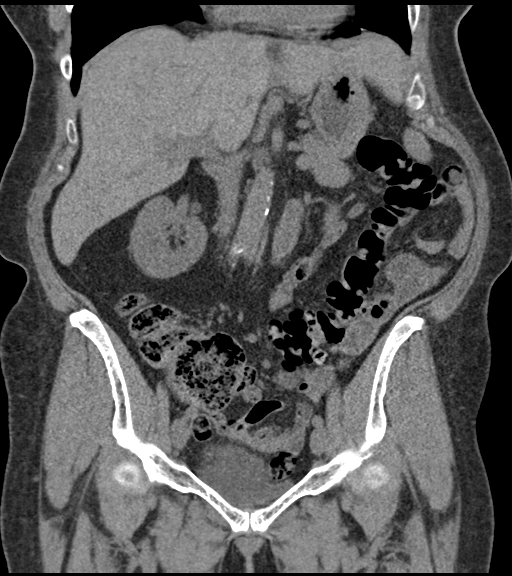
[im 75/135  soft-tissue]
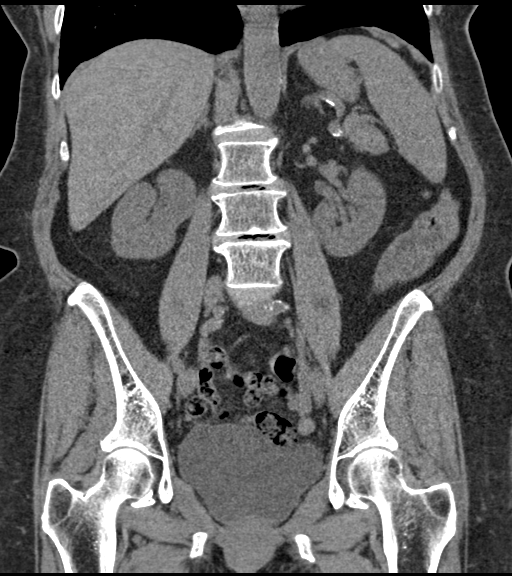

[16 of 46 positions shown; findings below may reference images not displayed]

FINDINGS: Lower chest: The visualized heart size within normal limits. No
pericardial fluid/thickening.

No hiatal hernia.

The visualized portions of the lungs are clear.

Hepatobiliary: Although limited due to the lack of intravenous
contrast, normal in appearance without gross focal abnormality. No
evidence of calcified gallstones or biliary ductal dilatation.

Pancreas:  Unremarkable.  No surrounding inflammatory changes.

Spleen: Normal in size. Although limited due to the lack of
intravenous contrast, normal in appearance.

Adrenals/Urinary Tract: Both adrenal glands appear normal. The
kidneys and collecting system appear normal without evidence of
urinary tract calculus or hydronephrosis. Bladder is unremarkable.

Stomach/Bowel: The stomach and small bowel are normal in appearance.
There is a moderate to large amount of colonic stool present.
Scattered colonic diverticula are noted without diverticulitis. The
patient is status post appendectomy.

Vascular/Lymphatic: There are no enlarged abdominal or pelvic lymph
nodes. Scattered aortic atherosclerotic calcifications are seen
without aneurysmal dilatation.

Reproductive: The uterus and adnexa are unremarkable.

Other: No evidence of abdominal wall mass or hernia.

Musculoskeletal: No acute or significant osseous findings.
IMPRESSION: No renal or collecting system calculi.

Diverticulosis without diverticulitis.

Moderate to large amount of colonic stool without evidence of
obstruction.

Aortic Atherosclerosis ([id number]).

## 2022-08-07 ENCOUNTER — Telehealth: Payer: Self-pay | Admitting: *Deleted

## 2022-08-07 NOTE — Telephone Encounter (Signed)
Spoke with patient regarding reclast. Patient states she has moved out of the area. Patient also states she declines using this in the future due to deep joint  pain she experienced after infusion.

## 2022-09-14 ENCOUNTER — Ambulatory Visit: Payer: Medicare HMO | Admitting: Nurse Practitioner

## 2023-06-30 ENCOUNTER — Other Ambulatory Visit: Payer: Self-pay

## 2023-06-30 ENCOUNTER — Emergency Department (HOSPITAL_COMMUNITY): Payer: Medicare Other

## 2023-06-30 ENCOUNTER — Emergency Department (HOSPITAL_COMMUNITY)
Admission: EM | Admit: 2023-06-30 | Discharge: 2023-06-30 | Disposition: A | Discharge status: Home / Self Care | Payer: Medicare Other | Attending: Emergency Medicine | Admitting: Emergency Medicine

## 2023-06-30 ENCOUNTER — Encounter (HOSPITAL_COMMUNITY): Payer: Self-pay

## 2023-06-30 DIAGNOSIS — S299XXA Unspecified injury of thorax, initial encounter: Secondary | ICD-10-CM | POA: Diagnosis present

## 2023-06-30 DIAGNOSIS — Z79899 Other long term (current) drug therapy: Secondary | ICD-10-CM | POA: Insufficient documentation

## 2023-06-30 DIAGNOSIS — S2232XA Fracture of one rib, left side, initial encounter for closed fracture: Secondary | ICD-10-CM | POA: Insufficient documentation

## 2023-06-30 DIAGNOSIS — X509XXA Other and unspecified overexertion or strenuous movements or postures, initial encounter: Secondary | ICD-10-CM | POA: Insufficient documentation

## 2023-06-30 DIAGNOSIS — I10 Essential (primary) hypertension: Secondary | ICD-10-CM | POA: Diagnosis not present

## 2023-06-30 MED ORDER — MORPHINE SULFATE 15 MG PO TABS: 15.0000 mg | ORAL_TABLET | Freq: Four times a day (QID) | ORAL | 0 refills | Status: DC | PRN

## 2023-06-30 MED ORDER — KETOROLAC TROMETHAMINE 30 MG/ML IJ SOLN
30.0000 mg | Freq: Once | INTRAMUSCULAR | Status: AC
  Administered 2023-06-30: 30 mg via INTRAMUSCULAR
  Filled 2023-06-30: qty 1

## 2023-06-30 NOTE — ED Triage Notes (Signed)
Pt BIB [facility] from home C/O SOB when taking deep breath. Per EMS, seen yesterday for rib fracture.

## 2023-06-30 NOTE — Discharge Instructions (Addendum)
Avoid any staying with the left arm.  Use lidocaine patches on the area that are painful.  You can take 2 Tylenol and 2 ibuprofen together every 6 hours as needed for pain.  If you are then having breakthrough pain you can use the morphine.  If you are having to use the morphine regularly take Colace to avoid constipation.

## 2023-06-30 NOTE — ED Provider Notes (Signed)
[place] EMERGENCY DEPARTMENT AT [facility] Provider Note   CSN: [id number] Arrival date & time: 06/30/23  4098     History  Chief Complaint  Patient presents with   Rib Injury    Tracy [name] is a 73 y.o. female.  Patient is a 73 year old female with a history of hypertension, osteoporosis who is presenting today with severe left-sided rib pain.  She reports she was bending over getting something out of the washer yesterday when she felt a pop in the left side of her ribs.  It was uncomfortable and she followed up with her orthopedist who did an x-ray in the office and told her she had 1 rib fracture.  She reports at the time the pain was only mildly uncomfortable and she refused any pain control.  However this morning the pain is severe she is having a hard time catching her breath or moving.  She feels like she is not able to get a deep breath because it so uncomfortable to breathe.  She has not tried anything for the pain.  The history is provided by the patient.       Home Medications Prior to Admission medications   Medication Sig Start Date End Date Taking? Authorizing Provider  morphine (MSIR) 15 MG tablet Take 1 tablet (15 mg total) by mouth every 6 (six) hours as needed for severe pain (pain score 7-10). 06/30/23  Yes Naketa Daddario, Alphonzo Lemmings, MD  amLODipine (NORVASC) 5 MG tablet Take 5 mg by mouth daily. 08/22/21   [provider]  Cholecalciferol (VITAMIN D PO) Take 2,000 Int'l Units/day by mouth.    [provider]  COVID-19 mRNA bivalent vaccine, Pfizer, (PFIZER COVID-19 VAC BIVALENT) injection Inject into the muscle. 12/08/21     Cyanocobalamin (VITAMIN B-12 PO) Take by mouth.    [provider]  cycloSPORINE (RESTASIS) 0.05 % ophthalmic emulsion 1 drop 2 (two) times daily.    [provider]  Flaxseed, Linseed, (FLAX SEEDS PO) Take by mouth.    [provider]  telmisartan (MICARDIS) 80 MG tablet Take 80 mg by mouth  daily. 07/11/19   [provider]  valACYclovir (VALTREX) 500 MG tablet Take 1 tablet (500 mg total) by mouth 2 (two) times daily. For 5 days with outbreaks 09/12/21   Olivia Mackie, NP  Zoster Vaccine Adjuvanted [facility]) injection Inject into the muscle. 12/08/21         Allergies    Patient has no known allergies.    Review of Systems   Review of Systems  Physical Exam Updated Vital Signs BP (!) 152/88   Pulse 71   Temp 98.1 F (36.7 C) (Oral)   Resp 20   Ht 5\' 2"  (1.575 m)   Wt 70.3 kg   SpO2 98%   BMI 28.35 kg/m  Physical Exam Vitals and nursing note reviewed.  Constitutional:      General: She is not in acute distress.    Appearance: She is well-developed.     Comments: Appears uncomfortable  HENT:     Head: Normocephalic and atraumatic.  Eyes:     Pupils: Pupils are equal, round, and reactive to light.  Cardiovascular:     Rate and Rhythm: Normal rate and regular rhythm.     Heart sounds: Normal heart sounds. No murmur heard.    No friction rub.  Pulmonary:     Effort: Pulmonary effort is normal.     Breath sounds: Normal breath sounds. No wheezing  or rales.  Chest:     Chest wall: Tenderness present.    Abdominal:     General: Bowel sounds are normal. There is no distension.     Palpations: Abdomen is soft.     Tenderness: There is no abdominal tenderness. There is no guarding or rebound.  Musculoskeletal:        General: No tenderness. Normal range of motion.     Comments: No edema  Skin:    General: Skin is warm and dry.     Findings: No rash.  Neurological:     Mental Status: She is alert and oriented to person, place, and time.     Cranial Nerves: No cranial nerve deficit.  Psychiatric:        Behavior: Behavior normal.     ED Results / Procedures / Treatments   Labs (all labs ordered are listed, but only abnormal results are displayed) Labs Reviewed - No data to display  EKG None  Radiology DG Chest 2 View Result Date:  06/30/2023 CLINICAL DATA:  Shortness of breath. EXAM: CHEST - 2 VIEW COMPARISON:  10/11/2021 FINDINGS: The heart size and mediastinal contours are within normal limits. Both lungs are clear. No pneumothorax or pleural effusion visualized. The visualized skeletal structures are unremarkable. IMPRESSION: No active cardiopulmonary disease. Electronically Signed   By: Danae Orleans M.D.   On: 06/30/2023 10:21    Procedures Procedures    Medications Ordered in ED Medications  ketorolac (TORADOL) 30 MG/ML injection 30 mg (30 mg Intramuscular Given 06/30/23 1478)    ED Course/ Medical Decision Making/ A&P                                 Medical Decision Making Amount and/or Complexity of Data Reviewed Radiology: ordered and independent interpretation performed. Decision-making details documented in ED Course.  Risk Prescription drug management.   Pt with multiple medical problems and comorbidities and presenting today with a complaint that caries a high risk for morbidity and mortality.  Here today with worsening rib pain after being told she has a rib fracture yesterday.  Pt appears to have severe pain which may be causing SOB but will r/o PTX.  No other trauma.  Low suspicion for intra-abdominal injury.  Patient given pain control here.  Plain film pending.  10:57 AM I have independently visualized and interpreted pt's images today.  CXR wnl without PTX.  Pt pain isimproved and at this time feel she is stable for d/c.         Final Clinical Impression(s) / ED Diagnoses Final diagnoses:  Closed fracture of one rib of left side, initial encounter    Rx / DC Orders ED Discharge Orders          Ordered    morphine (MSIR) 15 MG tablet  Every 6 hours PRN        06/30/23 1056              Gwyneth Sprout, MD 06/30/23 1057

## 2023-06-30 NOTE — ED Notes (Signed)
AVS with prescriptions provided to and discussed with patient. Pt verbalizes understanding of discharge instructions and denies any questions or concerns at this time. Pt ambulated out of department independently with steady gait.

## 2023-06-30 NOTE — ED Notes (Signed)
Pt transported to Xray.

## 2023-07-20 ENCOUNTER — Encounter: Payer: Self-pay | Admitting: Nurse Practitioner

## 2023-07-24 ENCOUNTER — Ambulatory Visit (INDEPENDENT_AMBULATORY_CARE_PROVIDER_SITE_OTHER): Payer: Medicare Other | Admitting: Nurse Practitioner

## 2023-07-24 ENCOUNTER — Encounter: Payer: Self-pay | Admitting: Nurse Practitioner

## 2023-07-24 VITALS — BP 138/72 | HR 92

## 2023-07-24 DIAGNOSIS — S2232XA Fracture of one rib, left side, initial encounter for closed fracture: Secondary | ICD-10-CM

## 2023-07-24 DIAGNOSIS — L723 Sebaceous cyst: Secondary | ICD-10-CM | POA: Diagnosis not present

## 2023-07-24 DIAGNOSIS — M810 Age-related osteoporosis without current pathological fracture: Secondary | ICD-10-CM

## 2023-07-24 NOTE — Progress Notes (Signed)
   Acute Office Visit  Subjective:    Patient ID: Tracy [name], female    DOB: 1950-06-20, 73 y.o.   MRN: [id number]   HPI 73 y.o. presents today for vulvar cysts. No pain, drainage, change in size. Also wants to discuss bone health. 02/2021 T-score -2.6 in left hip, all other sites osteopenic. Had reclast infusion 08/2021 and developed generalized joint pain a week later. Also had some fatigue. Considered Prolia but did not pursue due to costs at the time. H/O GERD. A month ago she was bending over to get something out of the washing machine and felt a pop in her left side. Reports xray at ortho showed 1 left rib fracture. Larey Seat on her left hip a year ago. Negative xray at that time. Hip still bothers her. Plans to discuss with ortho.   No LMP recorded. Patient is postmenopausal.    Review of Systems  Constitutional: Negative.   Genitourinary:  Positive for genital sores.       Objective:    Physical Exam Constitutional:      Appearance: Normal appearance.  Genitourinary:      BP 138/72   Pulse 92   SpO2 97%  Wt Readings from Last 3 Encounters:  06/30/23 155 lb (70.3 kg)  09/12/21 159 lb (72.1 kg)  08/26/21 153 lb 1 oz (69.4 kg)        Patient informed chaperone available to be present for breast and/or pelvic exam. Patient has requested no chaperone to be present. Patient has been advised what will be completed during breast and pelvic exam.   Assessment & Plan:   Problem List Items Addressed This Visit       Musculoskeletal and Integument   Osteoporosis without current pathological fracture - Primary   Relevant Medications      Other Relevant Orders   DG Bone Density   Other Visit Diagnoses       Closed fracture of one rib of left side, initial encounter         Sebaceous cyst          Plan: Reviewed most recent DXA in 2022. Will get an updated DXA at [facility]. Recommend starting treatment due to recent fracture. Discussed treatment options. Did not  tolerate Reclast. Wants to avoid oral bisphosphonates due to severe GERD. Interested in [facility]. Reassurance provided on small sebaceous cysts.   Return if symptoms worsen or fail to improve.    Olivia Mackie DNP, 2:18 PM 07/24/2023

## 2023-08-02 ENCOUNTER — Telehealth: Payer: Self-pay | Admitting: *Deleted

## 2023-08-02 NOTE — Telephone Encounter (Signed)
Insurance information submitted to [employer] portal. Will await summary of benefits for prolia.    

## 2023-08-02 NOTE — Telephone Encounter (Signed)
-----   Message from Olivia Mackie sent at 07/24/2023  2:16 PM EST ----- Regarding: Prolia start Prolia start. Did not tolerate Reclast, cannot do oral due to severe GERD. Osteoporosis, recent fracture. Planning to get an updated DXA soon.

## 2023-08-07 NOTE — Telephone Encounter (Signed)
 PA submitted via [employer] portal and approved through 08/06/24. Authorization # [id number].  Call to patient. Patient states she has BMD on 08/09/23 and would like to wait for results and review by Elmarie Shiley, NP prior to proceeding with Prolia injections.

## 2023-08-07 NOTE — Telephone Encounter (Signed)
 Insurance resubmitted to insurance to reflect updated [employer] coverage. Will await summary of benefits.

## 2023-08-09 ENCOUNTER — Ambulatory Visit (HOSPITAL_BASED_OUTPATIENT_CLINIC_OR_DEPARTMENT_OTHER)
Admission: RE | Admit: 2023-08-09 | Discharge: 2023-08-09 | Disposition: A | Discharge status: Home / Self Care | Payer: Medicare Other | Source: Ambulatory Visit | Attending: Nurse Practitioner | Admitting: Nurse Practitioner

## 2023-08-09 ENCOUNTER — Encounter: Payer: Self-pay | Admitting: Nurse Practitioner

## 2023-08-09 DIAGNOSIS — M810 Age-related osteoporosis without current pathological fracture: Secondary | ICD-10-CM | POA: Insufficient documentation

## 2023-08-14 ENCOUNTER — Other Ambulatory Visit: Payer: Self-pay | Admitting: *Deleted

## 2023-08-14 DIAGNOSIS — A609 Anogenital herpesviral infection, unspecified: Secondary | ICD-10-CM

## 2023-08-14 MED ORDER — VALACYCLOVIR HCL 500 MG PO TABS
500.0000 mg | ORAL_TABLET | Freq: Two times a day (BID) | ORAL | 0 refills | Status: AC
Start: 2023-08-14 — End: ?

## 2023-08-14 NOTE — Telephone Encounter (Signed)
 Medication refill request: valtrex  Last AEX:  09/12/21 TW Next AEX: 10/02/23  Last MMG (if hormonal medication request): n/a Refill authorized: please refill if appropriate   Medication pended for #30, 0RF.

## 2023-08-14 NOTE — Telephone Encounter (Signed)
 Call to patient. Message given to patient as seen below from [place], NP. Patient verbalized understanding. Patient states she would like to follow up with her Endodontist, Dr. Duffy Rhody at [facility] because she has had 2 bone grafts done recently and has a dead tooth she needs to have removed and her 2 lower front teeth that need follow up as well. Would like to discuss prolia with Dr. Duffy Rhody and return call if she desires to proceed.   Patient also states she is currently having a herpes outbreak from stress and needs update prescription. See refill request. Pharmacy on file confirmed.   Patient advised would follow up with Tiffany, NP on prolia at [facility] on 10/02/23.   Encounter closed.

## 2023-08-14 NOTE — Telephone Encounter (Signed)
 Thank you for the update and I agree with discussing with endodontics prior to starting Prolia. Valtrex refill provided.

## 2023-08-14 NOTE — Telephone Encounter (Signed)
-----   Message from Olivia Mackie sent at 08/09/2023  3:57 PM EST ----- May proceed with Prolia if patient agreeable.

## 2023-09-20 ENCOUNTER — Emergency Department (HOSPITAL_COMMUNITY)
Admission: EM | Admit: 2023-09-20 | Discharge: 2023-09-20 | Disposition: A | Discharge status: Home / Self Care | Attending: Emergency Medicine | Admitting: Emergency Medicine

## 2023-09-20 ENCOUNTER — Emergency Department (HOSPITAL_COMMUNITY)

## 2023-09-20 ENCOUNTER — Encounter (HOSPITAL_COMMUNITY): Payer: Self-pay

## 2023-09-20 ENCOUNTER — Other Ambulatory Visit: Payer: Self-pay

## 2023-09-20 DIAGNOSIS — K625 Hemorrhage of anus and rectum: Secondary | ICD-10-CM | POA: Insufficient documentation

## 2023-09-20 LAB — COMPREHENSIVE METABOLIC PANEL WITH GFR
ALT: 17 U/L (ref 0–44)
AST: 20 U/L (ref 15–41)
Albumin: 4 g/dL (ref 3.5–5.0)
Alkaline Phosphatase: 75 U/L (ref 38–126)
Anion gap: 8 (ref 5–15)
BUN: 26 mg/dL — ABNORMAL HIGH (ref 8–23)
CO2: 21 mmol/L — ABNORMAL LOW (ref 22–32)
Calcium: 9.4 mg/dL (ref 8.9–10.3)
Chloride: 107 mmol/L (ref 98–111)
Creatinine, Ser: 0.64 mg/dL (ref 0.44–1.00)
GFR, Estimated: >60 mL/min (ref >60)
Glucose, Bld: 99 mg/dL (ref 70–99)
Potassium: 3.8 mmol/L (ref 3.5–5.1)
Sodium: 136 mmol/L (ref 135–145)
Total Bilirubin: 0.8 mg/dL (ref 0.0–1.2)
Total Protein: 7 g/dL (ref 6.5–8.1)

## 2023-09-20 LAB — CBC
HCT: 39.4 % (ref 36.0–46.0)
Hemoglobin: 12.4 g/dL (ref 12.0–15.0)
MCH: 30.6 pg (ref 26.0–34.0)
MCHC: 31.5 g/dL (ref 30.0–36.0)
MCV: 97.3 fL (ref 80.0–100.0)
Platelets: 224 10*3/uL (ref 150–400)
RBC: 4.05 MIL/uL (ref 3.87–5.11)
RDW: 13.4 % (ref 11.5–15.5)
WBC: 8.5 10*3/uL (ref 4.0–10.5)
nRBC: 0 % (ref 0.0–0.2)

## 2023-09-20 LAB — POC OCCULT BLOOD, ED: Fecal Occult Bld: POSITIVE — AB

## 2023-09-20 LAB — TYPE AND SCREEN
ABO/RH(D): O POS
Antibody Screen: NEGATIVE

## 2023-09-20 MED ORDER — IOHEXOL 300 MG/ML  SOLN
100.0000 mL | Freq: Once | INTRAMUSCULAR | Status: AC | PRN
  Administered 2023-09-20: 100 mL via INTRAVENOUS

## 2023-09-20 NOTE — ED Notes (Signed)
 Assisted patient to bathroom, steady gait, given urine cup to gather sample

## 2023-09-20 NOTE — ED Provider Notes (Signed)
 Tracy [name] EMERGENCY DEPARTMENT AT [facility] Provider Note   CSN: [id number] Arrival date & time: 09/20/23  1442     History  Chief Complaint  Patient presents with   Rectal Bleeding    Tracy [name] is a 73 y.o. female.  The history is provided by the patient and medical records. No language interpreter was used.  Rectal Bleeding    73 year old female history of diverticular disease, IBS, constipation sent here by GI Dr. Tova Fresh with concerns of rectal bleeding.  Patient states yesterday she had a bowel movement and noticed large amount of rectal bleeding.  She waves and later on when she had another bowel movement it was normal.  She did not endorse any significant abdominal discomfort within she denies straining she denies any rectal pain or rectal itchiness denies any lightheadedness and dizziness and denies any fever or trouble urinating.  She was evaluated by her GI specialist today and was told that her bleeding is concerning for a diverticular bleed therefore patient was recommended to come to the ER for further evaluation.  Patient denies any trauma.  States overall she is doing fine.  Home Medications Prior to Admission medications   Medication Sig Start Date End Date Taking? Authorizing Provider  amLODipine (NORVASC) 5 MG tablet Take 5 mg by mouth daily. 08/22/21   [provider]  Cholecalciferol (VITAMIN D PO) Take 2,000 Int'l Units/day by mouth.    [provider]  Cholecalciferol 50 MCG (2000 UT) CAPS Take by mouth.    [provider]  COVID-19 mRNA bivalent vaccine, Pfizer, (PFIZER COVID-19 VAC BIVALENT) injection Inject into the muscle. 12/08/21     Cyanocobalamin (VITAMIN B-12 PO) Take by mouth.    [provider]  cycloSPORINE (RESTASIS) 0.05 % ophthalmic emulsion 1 drop 2 (two) times daily.    [provider]  Docusate Sodium (DSS) 100 MG CAPS Take by mouth.    [provider]  Flaxseed, Linseed, (FLAX  SEEDS PO) Take by mouth.    [provider]  FLUoxetine (PROZAC) 10 MG capsule  06/07/21   [provider]  morphine (MSIR) 15 MG tablet Take 1 tablet (15 mg total) by mouth every 6 (six) hours as needed for severe pain (pain score 7-10). 06/30/23   Almond Army, MD  telmisartan (MICARDIS) 80 MG tablet Take 80 mg by mouth daily. 07/11/19   [provider]  valACYclovir (VALTREX) 500 MG tablet Take 1 tablet (500 mg total) by mouth 2 (two) times daily. For 5 days with outbreaks 08/14/23   Andee Bamberger, NP  Zoster Vaccine Adjuvanted [facility]) injection Inject into the muscle. 12/08/21         Allergies    Fluoxetine, Other, Sulfa antibiotics, and Zoledronic acid    Review of Systems   Review of Systems  Gastrointestinal:  Positive for hematochezia.  All other systems reviewed and are negative.   Physical Exam Updated Vital Signs BP (!) 165/92 (BP Location: Right Arm)   Pulse 82   Temp 98.5 F (36.9 C) (Oral)   Resp 16   Ht 5\' 2"  (1.575 m)   Wt 74.8 kg   SpO2 100%   BMI 30.18 kg/m  Physical Exam Vitals and nursing note reviewed.  Constitutional:      General: She is not in acute distress.    Appearance: She is well-developed.  HENT:     Head: Atraumatic.  Eyes:     Conjunctiva/sclera: Conjunctivae normal.  Cardiovascular:  Rate and Rhythm: Normal rate and regular rhythm.     Pulses: Normal pulses.     Heart sounds: Normal heart sounds.  Pulmonary:     Effort: Pulmonary effort is normal.  Abdominal:     General: Bowel sounds are normal.     Palpations: Abdomen is soft.     Tenderness: There is no abdominal tenderness.  Genitourinary:    Comments: Chaperone present during exam.  Normal rectal tone no obvious mass maroon color stool on glove no thrombosed external hemorrhoid and no anal fissure noted. Musculoskeletal:     Cervical back: Neck supple.  Skin:    Findings: No rash.  Neurological:     Mental Status: She is alert.   Psychiatric:        Mood and Affect: Mood normal.     ED Results / Procedures / Treatments   Labs (all labs ordered are listed, but only abnormal results are displayed) Labs Reviewed  COMPREHENSIVE METABOLIC PANEL WITH GFR - Abnormal; Notable for the following components:      Result Value   CO2 21 (*)    BUN 26 (*)    All other components within normal limits  POC OCCULT BLOOD, ED - Abnormal; Notable for the following components:   Fecal Occult Bld POSITIVE (*)    All other components within normal limits  CBC  TYPE AND SCREEN  ABO/RH    EKG None  Radiology CT ABDOMEN PELVIS W CONTRAST Result Date: 09/20/2023 CLINICAL DATA:  Acute nonlocalized abdominal pain, rectal bleeding EXAM: CT ABDOMEN AND PELVIS WITH CONTRAST TECHNIQUE: Multidetector CT imaging of the abdomen and pelvis was performed using the standard protocol following bolus administration of intravenous contrast. RADIATION DOSE REDUCTION: This exam was performed according to the departmental dose-optimization program which includes automated exposure control, adjustment of the mA and/or kV according to patient size and/or use of iterative reconstruction technique. CONTRAST:  OMNIPAQUE IOHEXOL 300 MG/ML  SOLN COMPARISON:  11/19/2019 FINDINGS: Lower chest: No acute abnormality. Hepatobiliary: No focal liver abnormality is seen. No gallstones, gallbladder wall thickening, or biliary dilatation. Pancreas: Unremarkable Spleen: Unremarkable Adrenals/Urinary Tract: Adrenal glands are unremarkable. Kidneys are normal, without renal calculi, focal lesion, or hydronephrosis. Bladder is unremarkable. Stomach/Bowel: Status post sigmoid colectomy and appendectomy moderate diverticulosis of the distal transverse, descending, and residual sigmoid colon. The stomach, small bowel, and large bowel are otherwise unremarkable. No evidence of obstruction or focal inflammation. No free intraperitoneal gas or fluid. Vascular/Lymphatic: Aortic  atherosclerosis. No enlarged abdominal or pelvic lymph nodes. Reproductive: Uterus and bilateral adnexa are unremarkable. Other: No abdominal wall hernia or abnormality. No abdominopelvic ascites. Musculoskeletal: No acute bone abnormality. No lytic or blastic bone lesion. Osseous structures are age appropriate. IMPRESSION: 1. No acute intra-abdominal pathology identified. No definite radiographic explanation for the patient's reported symptoms. 2. Status post sigmoid colectomy and appendectomy. 3. Moderate distal colonic diverticulosis. 4. Aortic atherosclerosis. Aortic Atherosclerosis (ICD10-I70.0). Electronically Signed   By: Helyn Numbers M.D.   On: 09/20/2023 20:06    Procedures Procedures    Medications Ordered in ED Medications  iohexol (OMNIPAQUE) 300 MG/ML solution 100 mL (100 mLs Intravenous Contrast Given 09/20/23 1814)    ED Course/ Medical Decision Making/ A&P                                 Medical Decision Making Amount and/or Complexity of Data Reviewed Labs: ordered.   BP (!) 165/92 (  BP Location: Right Arm)   Pulse 82   Temp 98.5 F (36.9 C) (Oral)   Resp 16   Ht 5\' 2"  (1.575 m)   Wt 74.8 kg   SpO2 100%   BMI 30.18 kg/m   57:76 PM  73 year old female history of diverticular disease, IBS, constipation sent here by GI Dr. Loreta Ave with concerns of rectal bleeding.  Patient states yesterday she had a bowel movement and noticed large amount of rectal bleeding.  She waves and later on when she had another bowel movement it was normal.  She did not endorse any significant abdominal discomfort within she denies straining she denies any rectal pain or rectal itchiness denies any lightheadedness and dizziness and denies any fever or trouble urinating.  She was evaluated by her GI specialist today and was told that her bleeding is concerning for a diverticular bleed therefore patient was recommended to come to the ER for further evaluation.  Patient denies any trauma.  States  overall she is doing fine.  On exam this is a well-appearing female resting comfortably in bed appears to be in no acute discomfort.  She does have maroon-colored stool on rectal exam.  Chaperone was present.  I suspect her symptoms likely diverticular bleed.  CT scan of abdomen pelvis ordered.  -Labs ordered, independently viewed and interpreted by me.  Labs remarkable for fecal occult blood test positive.  Hemoglobin otherwise normal at 12.4.  Normal electrolytes. -The patient was maintained on a cardiac monitor.  I personally viewed and interpreted the cardiac monitored which showed an underlying rhythm of: Normal sinus rhythm -Imaging independently viewed and interpreted by me and I agree with radiologist's interpretation.  Result remarkable for abdominal pelvis CT scan without any acute finding.  Evidence of diverticulosis but no evidence of diverticulitis. -This patient presents to the ED for concern of rectal bleeding, this involves an extensive number of treatment options, and is a complaint that carries with it a high risk of complications and morbidity.  The differential diagnosis includes lower GI bleed, upper GI bleed, hemorrhoidal bleed, AVM, malignancy, colitis, diverticular bleed -Co morbidities that complicate the patient evaluation includes IBS, diverticular disease, constipation -Treatment includes monitoring -Reevaluation of the patient after these medicines showed that the patient improved -PCP office notes or outside notes reviewed -Discussion with attending Dr. Doran Durand -Escalation to admission/observation considered: patients feels much better, is comfortable with discharge, and will follow up with PCP -Prescription medication considered, patient comfortable with MiraLAX -Social Determinant of Health considered which includes history of tobacco use  Patient did not have any bowel movement during this ER visit despite being here for 8 hours.  I discussed option of admission  versus outpatient follow-up with her GI specialist.  At this time I felt patient stable for outpatient follow-up and patient agrees.  She will contact Dr. Loreta Ave.  She would likely benefit from outpatient colonoscopy for GI bleeding.  I gave patient return precaution.         Final Clinical Impression(s) / ED Diagnoses Final diagnoses:  Rectal bleeding    Rx / DC Orders ED Discharge Orders     None         Fayrene Helper, PA-C 09/20/23 2253    Glyn Ade, MD 09/20/23 2352

## 2023-09-20 NOTE — Discharge Instructions (Signed)
 Your rectal bleeding is likely due to a lower GI bleed.  Call and follow-up closely with your GI specialist for outpatient evaluation.  You will likely benefit from a colonoscopy.  Continue to take MiraLAX as needed for constipation.  Return if condition worsens or if you have other concern.

## 2023-09-20 NOTE — ED Triage Notes (Signed)
 States she had bright red bleeding last night, then a regular bowel movement. Went to PCP and was told she has a fistula. Denies any pain, n/v/d

## 2023-09-20 NOTE — ED Provider Triage Note (Signed)
 Emergency Medicine Provider Triage Evaluation Note  Tracy [name] , a 73 y.o. female  was evaluated in triage.  Pt complains of rectal bleeding. Patient reports passing a blood clot light night and blood with wiping. Denies blood thinner use. Saw GI doctor this morning and was told that she still has blood in the rectum and to come to the ED. History of diverticulitis.   Review of Systems  Positive: Rectal bleeding Negative: Abdominal pain  Physical Exam  BP (!) 165/92 (BP Location: Right Arm)   Pulse 82   Temp 98.5 F (36.9 C) (Oral)   Resp 16   Ht 5\' 2"  (1.575 m)   Wt 74.8 kg   SpO2 100%   BMI 30.18 kg/m  Gen:   Awake, no distress   Resp:  Normal effort  MSK:   Moves extremities without difficulty  Other:  Periumbilical tenderness   Medical Decision Making  Medically screening exam initiated at 3:30 PM.  Appropriate orders placed.  SHARISSE RANTZ was informed that the remainder of the evaluation will be completed by another provider, this initial triage assessment does not replace that evaluation, and the importance of remaining in the ED until their evaluation is complete.    Sonnie Dusky, PA-C 09/20/23 1535

## 2023-09-20 NOTE — ED Notes (Signed)
 Pt denies chest pain and sob at this time, in room in nad, assisted PA in guaiac check, pt hooked to monitor, and handed phone, call bell in reach

## 2023-10-02 ENCOUNTER — Encounter: Payer: Self-pay | Admitting: Nurse Practitioner

## 2023-10-02 ENCOUNTER — Ambulatory Visit (INDEPENDENT_AMBULATORY_CARE_PROVIDER_SITE_OTHER): Payer: Medicare Other | Admitting: Nurse Practitioner

## 2023-10-02 VITALS — BP 124/74 | HR 92 | Ht 62.0 in | Wt 164.0 lb

## 2023-10-02 DIAGNOSIS — B009 Herpesviral infection, unspecified: Secondary | ICD-10-CM

## 2023-10-02 DIAGNOSIS — Z780 Asymptomatic menopausal state: Secondary | ICD-10-CM | POA: Diagnosis not present

## 2023-10-02 DIAGNOSIS — M810 Age-related osteoporosis without current pathological fracture: Secondary | ICD-10-CM

## 2023-10-02 DIAGNOSIS — Z9189 Other specified personal risk factors, not elsewhere classified: Secondary | ICD-10-CM | POA: Diagnosis not present

## 2023-10-02 DIAGNOSIS — Z01419 Encounter for gynecological examination (general) (routine) without abnormal findings: Secondary | ICD-10-CM

## 2023-10-02 NOTE — Progress Notes (Signed)
   Tracy [name] 03/06/1951 [id number]   History:  74 y.o. [id number] presents for breast and pelvic exam.  Postmenopausal - no HRT, no bleeding. Normal pap and mammogram history. Osteoporosis - did not tolerate Reclast . Plans to start Prolia after discussing with endodontics. Had bone grafts and teeth removal and needs follow up with them because bone is growing out of gums. Fractured rib in February from bending over washing machine. History of IBC-C managed by GI. Did pelvic floor PT a couple of years ago due to inability to empty bladder and has done well since.   Gynecologic History No LMP recorded. Patient is postmenopausal.   Contraception: post menopausal status Sexually active: No  Health Maintenance Last Pap: 08/26/2018. Results were: Normal Last mammogram: 2024. Results were: Normal per patient Last colonoscopy: 01/15/2017. Results were: Normal, 10-year recall Last Dexa: 08/09/2023. Results were: T-score -2.2   Past medical history, past surgical history, family history and social history were all reviewed and documented in the EPIC chart. Moved back from [place]. Looking for job. 3 grandchildren.  ROS:  A ROS was performed and pertinent positives and negatives are included.  Exam:  Vitals:   10/02/23 1552  BP: 124/74  Pulse: 92  SpO2: 96%  Weight: 164 lb (74.4 kg)  Height: 5\' 2"  (1.575 m)    Body mass index is 30 kg/m.  General appearance:  Normal Thyroid :  Symmetrical, normal in size, without palpable masses or nodularity. Respiratory  Auscultation:  Clear without wheezing or rhonchi Cardiovascular  Auscultation:  Regular rate, without rubs, murmurs or gallops  Edema/varicosities:  Not grossly evident Abdominal  Soft,nontender, without masses, guarding or rebound.  Liver/spleen:  No organomegaly noted  Hernia:  None appreciated  Skin  Inspection:  Grossly normal   Breasts: Examined lying and sitting.   Right: Without masses, retractions, discharge or axillary  adenopathy.   Left: Without masses, retractions, discharge or axillary adenopathy. Gentitourinary   Inguinal/mons:  Normal without inguinal adenopathy  External genitalia:  Normal  BUS/Urethra/Skene's glands:  Normal  Vagina:  Cystocele, atrophy present  Cervix:  Normal  Uterus:  Normal in size, shape and contour.  Midline and mobile  Adnexa/parametria:     Rt: Without masses or tenderness.   Lt: Without masses or tenderness.  Anus and perineum: Normal  Digital rectal exam: Not indicated  Tracy Garden, NP student present as chaperone.   Assessment/Plan:  73 y.o. [id number] for breast and pelvic exam.   Well female exam with routine gynecological exam - Education provided on SBEs, importance of preventative screenings, current guidelines, high calcium diet, regular exercise, and multivitamin daily. Labs done elsewhere.   Age-related osteoporosis without current pathological fracture - 08/2023 T-score -2.2 with recent rib fracture. Did not tolerate Reclast  in 2023. Interested in Prolia. Will reach out after discussing with endodontics. Also consider endo referral for management. Planning to get back to exercising once rib is healed.   Screening for cervical cancer - Normal Pap history.  No longer screening per guidelines.  Screening for breast cancer - Normal mammogram history.  Continue annual screenings.  Normal breast exam today.  Screening for colon cancer - 2018 colonoscopy. Will repeat at GI's recommended interval.   Return in about 2 years (around 10/01/2025) for B&P.      Andee Bamberger DNP, 4:28 PM 10/02/2023

## 2023-10-03 ENCOUNTER — Encounter: Payer: Self-pay | Admitting: Professional Counselor

## 2023-10-03 ENCOUNTER — Ambulatory Visit: Payer: Self-pay | Admitting: Professional Counselor

## 2023-10-03 DIAGNOSIS — F4323 Adjustment disorder with mixed anxiety and depressed mood: Secondary | ICD-10-CM
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
## 2023-10-11 ENCOUNTER — Encounter: Payer: Self-pay | Admitting: Professional Counselor

## 2023-10-11 ENCOUNTER — Ambulatory Visit: Admitting: Professional Counselor

## 2023-10-11 DIAGNOSIS — F4323 Adjustment disorder with mixed anxiety and depressed mood: Secondary | ICD-10-CM | POA: Diagnosis not present

## 2023-10-11 NOTE — Progress Notes (Signed)
      [facility] Counselor/Therapist Progress Note  Patient ID: MONYA KOZAKIEWICZ, MRN: [id number],    Date: 10/11/2023  Time Spent: 2:06 PM to 3:04 PM  Treatment Type: Individual Therapy  Reported Symptoms: Worries, stress, discouragement, frustration, irritability, self-esteem concerns, low mood, phase of life concerns, career concerns; strong negative beliefs and feelings, anhedonia, emotional distancing, trouble concentrating, memory lapses  Mental Status Exam:  Appearance:   Neat     Behavior:  Appropriate, Sharing, and Motivated  Motor:  Normal  Speech/Language:   Clear and Coherent and Normal Rate  Affect:  Appropriate and Congruent  Mood:  normal  Thought process:  normal  Thought content:    WNL  Sensory/Perceptual disturbances:    WNL  Orientation:  oriented to person, place, time/date, and situation  Attention:  Good  Concentration:  Good  Memory:  WNL  Fund of knowledge:   Good  Insight:    Good  Judgment:   Good  Impulse Control:  Good   Risk Assessment: Danger to Self:  No Self-injurious Behavior: No Danger to Others: No Duty to Warn:no Physical Aggression / Violence:No  Access to Firearms a concern: No  Gang Involvement:No   Subjective: Patient presented to session to address concerns of anxiety and depression.  She reported mixed progress at this time.  Patient reported continuing to struggle with symptoms as described above, particularly related to phase of life concerns and trying to find a job at this time.  Patient and counselor continued to discuss patient trauma history.  Counselor facilitated PCL 5, patient score 14.  Counselor and patient discussed results.  Patient voiced feeling that her trauma history and impact has eroded her sense of self and wellbeing over time, and she processed events and impact with counselor.  Counselor held space for patient trauma processing, and helped to facilitate insight and resource meaning making and identification of  patient strengths.  Counselor and patient discussed patient treatment plan, and patient gave her consent.  Patient voiced I want to participate in my life as related to self discovery and life purpose.  Counselor affirmed and encouraged patient, helping to instill hope.  Counselor also assisted patient with ideas for job leads.  Interventions: Solution-Oriented/Positive Psychology, Humanistic/Existential, Insight-Oriented, and Assessments, Treatment Planning  Diagnosis:   ICD-10-CM   1. Adjustment disorder with mixed anxiety and depressed mood  F43.23       Plan: Patient is scheduled for follow-up; continue process work and developing coping skills.  STG between sessions to continue to apply to jobs, and resource self compassion around her phase of life and career concerns.  Progress note was dictated with Dragon and reviewed for accuracy.  [name], [facility]

## 2023-10-18 ENCOUNTER — Other Ambulatory Visit: Payer: Self-pay | Admitting: *Deleted

## 2023-10-18 ENCOUNTER — Telehealth: Payer: Self-pay | Admitting: *Deleted

## 2023-10-18 DIAGNOSIS — M810 Age-related osteoporosis without current pathological fracture: Secondary | ICD-10-CM

## 2023-10-18 MED ORDER — DENOSUMAB 60 MG/ML ~~LOC~~ SOSY
60.0000 mg | PREFILLED_SYRINGE | Freq: Once | SUBCUTANEOUS | Status: AC
  Administered 2023-10-25: 60 mg via SUBCUTANEOUS

## 2023-10-18 NOTE — Telephone Encounter (Signed)
 Patient returned call stating she was ready to schedule prolia. Patient states she spoke with her Dentist, Dr. Terrye Fiedler and he said the 3 teeth she is having issues with are "not critical at this time." No treatment needed. Patient also wants to make sure Jyl Or is aware of the GI bleed she had in April. Advised would review with Tiffany, NP and return call with recommendations or to cancel prolia injection if contraindicated. Patient agreeable.   Routing to provider for review.

## 2023-10-18 NOTE — Telephone Encounter (Signed)
 I have reviewed her ER visit and follow up visit with GI from April. OK to proceed with Prolia.

## 2023-10-24 ENCOUNTER — Ambulatory Visit (INDEPENDENT_AMBULATORY_CARE_PROVIDER_SITE_OTHER)

## 2023-10-24 DIAGNOSIS — M810 Age-related osteoporosis without current pathological fracture: Secondary | ICD-10-CM

## 2023-10-24 NOTE — Progress Notes (Signed)
 Prolia  injection given SQ right arm.  Patient tolerated injection well.  Annual exam: 10/02/23 TW Calcium:    9.4        Date: 09/20/23  Upcoming dental procedures: No , Dr. Terrye Fiedler aware of prolia -- and states the 3 teeth she is having issues with are "not critical at this time."  Hx of Kidney Disease: No  Last Bone Density Scan: 3/6/2

## 2023-10-25 DIAGNOSIS — M810 Age-related osteoporosis without current pathological fracture: Secondary | ICD-10-CM | POA: Diagnosis not present

## 2023-11-05 ENCOUNTER — Encounter: Payer: Self-pay | Admitting: Professional Counselor

## 2023-11-05 ENCOUNTER — Ambulatory Visit: Admitting: Professional Counselor

## 2023-11-05 DIAGNOSIS — F4323 Adjustment disorder with mixed anxiety and depressed mood: Secondary | ICD-10-CM

## 2023-11-05 NOTE — Progress Notes (Signed)
      [facility] Counselor/Therapist Progress Note  Patient ID: Tracy [name], MRN: [id number],    Date: 11/05/2023  Time Spent: 1;10 PM - 2:14 PM   Treatment Type: Individual Therapy  Reported Symptoms: tearfulness, worries, stress, low mood, loneliness, frustration, agitation  Mental Status Exam:  Appearance:   Neat     Behavior:  Appropriate, Sharing, and Motivated  Motor:  Normal  Speech/Language:   Clear and Coherent and Normal Rate  Affect:  Appropriate, Congruent, and Tearful  Mood:  normal  Thought process:  normal  Thought content:    WNL  Sensory/Perceptual disturbances:    WNL  Orientation:  oriented to person, place, time/date, and situation  Attention:  Good  Concentration:  Good  Memory:  WNL  Fund of knowledge:   Good  Insight:    Good  Judgment:   Good  Impulse Control:  Good   Risk Assessment: Danger to Self:  No Self-injurious Behavior: No Danger to Others: No Duty to Warn:no Physical Aggression / Violence:No  Access to Firearms a concern: No  Gang Involvement:No   Subjective: Patient presented to session to address concerns of anxiety and depression.  She reported minimal progress at this time.  Patient voiced ongoing frustration regarding career development.  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  Counselor affirmed patient and reinforced patient proactive sense of agency in her life.  She processed her experience of low self-esteem and how this relates to desire for connection and a sense of community, which she feels to be a deficit at this time.  She reflected on financial stress and her desire for health and medical needs that she struggles with, and how this also relates to her self-esteem.  Counselor affirmed patient feelings and helped patient to identify loved ones in her life with whom she feels to be kindred spirits, and encouraged  patient to nourish these relationships.  Counselor helped patient to identify other protective factors in her life.  Counselor and patient discussed the idea of patient resourcing a church community or other community opportunity to create new connections.  Interventions: Solution-Oriented/Positive Psychology, Humanistic/Existential, and Insight-Oriented  Diagnosis:   ICD-10-CM   1. Adjustment disorder with mixed anxiety and depressed mood  F43.23       Plan: Pt is scheduled for a follow-up in 2 wks. Continue self esteem building and help with personal goal planning and pursuit. Pt short term goal between sessions to consider outlets for community and connection including options such as art classes, joining a church community, increased resourcing of close friendships; practice mindfulness regarding capacity to receive support as with brother.  [name], [facility]

## 2023-11-09 DIAGNOSIS — M25552 Pain in left hip: Secondary | ICD-10-CM | POA: Diagnosis not present

## 2023-11-09 DIAGNOSIS — R262 Difficulty in walking, not elsewhere classified: Secondary | ICD-10-CM | POA: Diagnosis not present

## 2023-11-16 DIAGNOSIS — M25552 Pain in left hip: Secondary | ICD-10-CM | POA: Diagnosis not present

## 2023-11-16 DIAGNOSIS — R262 Difficulty in walking, not elsewhere classified: Secondary | ICD-10-CM | POA: Diagnosis not present

## 2023-11-22 DIAGNOSIS — R140 Abdominal distension (gaseous): Secondary | ICD-10-CM | POA: Diagnosis not present

## 2023-11-22 DIAGNOSIS — K581 Irritable bowel syndrome with constipation: Secondary | ICD-10-CM | POA: Diagnosis not present

## 2023-11-22 DIAGNOSIS — I10 Essential (primary) hypertension: Secondary | ICD-10-CM | POA: Diagnosis not present

## 2023-11-23 ENCOUNTER — Ambulatory Visit: Admitting: Professional Counselor

## 2023-11-23 ENCOUNTER — Encounter: Payer: Self-pay | Admitting: Professional Counselor

## 2023-11-23 DIAGNOSIS — R262 Difficulty in walking, not elsewhere classified: Secondary | ICD-10-CM | POA: Diagnosis not present

## 2023-11-23 DIAGNOSIS — F4323 Adjustment disorder with mixed anxiety and depressed mood: Secondary | ICD-10-CM | POA: Diagnosis not present

## 2023-11-23 DIAGNOSIS — M25552 Pain in left hip: Secondary | ICD-10-CM | POA: Diagnosis not present

## 2023-11-23 NOTE — Progress Notes (Signed)
      [facility] Counselor/Therapist Progress Note  Patient ID: Tracy [name], MRN: [id number],    Date: 11/23/2023  Time Spent: 9:13 AM - 10:11 AM   Treatment Type: Individual Therapy  Reported Symptoms: anhedonia, low mood, fatigue, low self esteem, worries, restlessness, nervousness, trouble relaxing, irritability, stress, phase of life concerns, health concerns   Mental Status Exam:  Appearance:   Neat     Behavior:  Appropriate, Sharing, and Motivated  Motor:  Normal  Speech/Language:   Clear and Coherent and Normal Rate  Affect:  Appropriate and Congruent  Mood:  normal  Thought process:  normal  Thought content:    WNL  Sensory/Perceptual disturbances:    WNL  Orientation:  oriented to person, place, time/date, and situation  Attention:  Good  Concentration:  Good  Memory:  WNL  Fund of knowledge:   Good  Insight:    Good  Judgment:   Good  Impulse Control:  Good   Risk Assessment: Danger to Self:  No Self-injurious Behavior: No Danger to Others: No Duty to Warn:no Physical Aggression / Violence:No  Access to Firearms a concern: No  Gang Involvement:No   Subjective: Patient presented to session to address concerns of anxiety and depression.  She reported mixed progress at this time.  She reported her experience of IBS to be of concern at this time, however voiced coping mechanism of her color certification materials as helpful and relaxing.  She identified intention to do pelvic physical therapy.  Patient voiced desire for increased social connections.  She processed experience of her upbringing and how it impacted her development and continues to have resonance into adulthood, and processed experience of trauma from childhood.  Counselor actively listened, held space for patient process work, helped to facilitate meaning making and insight, and reinforced patient strengths and resourcing.  Interventions: Solution-Oriented/Positive Psychology,  Humanistic/Existential, and Insight-Oriented  Diagnosis:   ICD-10-CM   1. Adjustment disorder with mixed anxiety and depressed mood  F43.23       Plan: Pt is scheduled for a follow-up; continue process work and developing coping skills. STG between sessions to continue to work on preparations for certification program, and focus on self-care and physical health needs.  [name], [facility]

## 2023-11-30 DIAGNOSIS — R262 Difficulty in walking, not elsewhere classified: Secondary | ICD-10-CM | POA: Diagnosis not present

## 2023-11-30 DIAGNOSIS — M25552 Pain in left hip: Secondary | ICD-10-CM | POA: Diagnosis not present

## 2023-12-04 ENCOUNTER — Ambulatory Visit: Admitting: Internal Medicine

## 2023-12-04 ENCOUNTER — Encounter: Payer: Self-pay | Admitting: Internal Medicine

## 2023-12-04 VITALS — BP 130/64 | HR 81 | Ht 62.0 in | Wt 166.6 lb

## 2023-12-04 DIAGNOSIS — E559 Vitamin D deficiency, unspecified: Secondary | ICD-10-CM

## 2023-12-04 DIAGNOSIS — M810 Age-related osteoporosis without current pathological fracture: Secondary | ICD-10-CM

## 2023-12-04 NOTE — Patient Instructions (Addendum)
 Please stop at the lab.  Please continue 2000 units vitamin D  daily.  Return to see me as needed.

## 2023-12-04 NOTE — Progress Notes (Signed)
 Patient ID: Tracy [name], female   DOB: 03-27-1951, 73 y.o.   MRN: [id number]   HPI  Tracy [name] is a 73 y.o.-year-old female, initially referred by Dr. Rockney, returning for follow-up for osteoporosis (OP) and vitamin D  insufficiency.  Last visit almost 3 years ago. She moved to [place] recently.  Interim history: Since last visit, she had a fractured patella 2 years ago, then she fell in [place] (down a step, on marble) >> no fracture, and she had a rib fracture in 06/2023. No dizziness, vertigo, blurry or double vision. She has IBS with constipation.  She is seeing gastroenterology. She saw a chiropractor - Dr. Delaney. [facility].  She would not necessarily want to return to the same practice. Since last visit, she tried but she developed pain in her legs and had to stop.  Started Prolia  10/18/2023 by Rosario.  She tolerated this well.  Reviewed history: Pt was dx with OP in 2020, previously osteopenia.  Reviewed previous DXA scan records: Date L1-L4 T score FN T score Total Hip T score  FRAX  08/09/2023 ([facility]) L1-L3: -1.7 RFN: -2.1 LFN: -2.2 RTH: -2.2 LTH: -2.1 21.2% for MOF 5.3% for hip fracture  12/05/2018 ([facility])  -1.5 (-0.2%) RFN: -1.3 (+15.2%*) LFN: -2.1 (+0.7%) RTH: -2.0 (-0.8%) LTH: -2.5 (-2.5%)   08/15/2016 ([facility])  -1.5 RFN: -2.2 LFN: -2.2 RTH: -1.9 (+0.4%) LTH: -2.3 (-4.5%*)   07/27/2014 ([facility])  -1.4 RFN: -2.2 LFN: -2.2 RTH: -2.0 LTH: -2.1    She subluxated her R hip >> has right hip pain-chronic.  She now also has pain in the left hip after falling in [place] approximately a year ago. She had a patellar fracture fracture in 2023. She had a rib fracture in 06/2023.  She had an episode of vertigo in 2013 - resolved. Sees Dr. Waylan - ophthalmology - hole in the retina.  Previous osteoporosis treatments: - Fosamax (few years ago) >> GERD. - Reclast  - Spring 2023 >> pain in legs - Prolia   - 10/18/2023  Of note, she has a history  of jaw bone loss. She has bruxism - has night guard.  She previously broke 3 teeth. She is now using Invisalign.  She has a history of vitamin D  insufficiency: 08/22/2023: vit D 30 Lab Results  Component Value Date   VD25OH 32.85 02/23/2021   VD25OH 31.7 02/24/2020   VD25OH 29.29 (L) 02/24/2019   Pt is on: - vitamin D  2000 units daily  + intermittent weightbearing exercises.  She walks/does yoga, but also not consistently. She was doing gyrotonic exercises while in [place].  She does not take high vitamin A doses.  Menopause was at 73 y/o.   + FH of osteoporosis -mother with pubic bone hairline fracture amount  No history of kidney stones, hyper or hypocalcemia or hyperparathyroidism: 08/22/2023: PTH 31, Ca 9.6 Lab Results  Component Value Date   CALCIUM 9.4 09/20/2023   CALCIUM 9.8 08/01/2021   CALCIUM 9.8 08/01/2021   CALCIUM 9.1 07/30/2021   CALCIUM 9.9 05/02/2021   CALCIUM 9.4 11/19/2019   CALCIUM 10.0 02/24/2019    No history of thyrotoxicosis: 11/22/2023: TSH 1.410 Lab Results  Component Value Date   TSH 1.809 07/30/2021   TSH 2.22 02/24/2020    No history of CKD: Lab Results  Component Value Date   BUN 26 (H) 09/20/2023   CREATININE 0.64 09/20/2023    She has osteoarthritis.  RA was ruled out by Dr. Dolphus.  She  sees a chiropractor for low back pain. She has a history of hypertension. She also sees Dr. Kristie for constipation >> on Linzess. Tried many different remedies >>  Not effective.  This is very bothersome for her.  ROS: + See HPI I reviewed pt's medications, allergies, PMH, social hx, family hx, and changes were documented in the history of present illness. Otherwise, unchanged from my initial visit note.  Past Medical History:  Diagnosis Date   ASCUS favor benign 2000   negative pap smears since   Constipation    Diverticulitis    HSV (herpes simplex virus) anogenital infection    IBS (irritable bowel syndrome)    Postmenopausal osteoporosis  12/2018   T score -2.5   Past Surgical History:  Procedure Laterality Date   ANAL RECTAL MANOMETRY N/A 10/05/2021   Procedure: ANO RECTAL MANOMETRY;  Surgeon: Nandigam, Kavitha V, MD;  Location: [facility];  Service: Endoscopy;  Laterality: N/A;   APPENDECTOMY     COLON SURGERY  2012   Uterine polyp     Social History   Socioeconomic History   Marital status: Single    Spouse name: Not on file   Number of children: 1 - son 50 y/o in 02/2019 -she is soon to become a grandmother   Years of education: Not on file   Highest education level: Not on file  Occupational History   Not on file  Social Needs   Financial resource strain: Not on file   Food insecurity    Worry: Not on file    Inability: Not on file   Transportation needs    Medical: Not on file    Non-medical: Not on file  Tobacco Use   Smoking status: Former Smoker, quit in 1975   Smokeless tobacco: Never Used   Tobacco comment: QUIT 1974  Substance and Sexual Activity   Alcohol use: Yes- - 2 glasses 4-7 times a week       Drug use: No   Current Outpatient Medications on File Prior to Visit  Medication Sig Dispense Refill   amLODipine (NORVASC) 2.5 MG tablet Take 1 tablet by mouth daily.     Cholecalciferol (VITAMIN D  PO) Take 2,000 Int'l Units/day by mouth.     Cholecalciferol 50 MCG (2000 UT) CAPS Take by mouth.     Cyanocobalamin (VITAMIN B-12 PO) Take by mouth.     cycloSPORINE (RESTASIS) 0.05 % ophthalmic emulsion 1 drop 2 (two) times daily. (Patient not taking: Reported on 10/02/2023)     Flaxseed, Linseed, (FLAX SEEDS PO) Take by mouth.     telmisartan (MICARDIS) 80 MG tablet Take 80 mg by mouth daily.     valACYclovir  (VALTREX ) 500 MG tablet Take 1 tablet (500 mg total) by mouth 2 (two) times daily. For 5 days with outbreaks 30 tablet 0   No current facility-administered medications on file prior to visit.   Allergies  Allergen Reactions   Fluoxetine     Other Reaction(s): Not available   Other      Other Reaction(s): Not available   Sulfa  Antibiotics    Zoledronic  Acid     Other Reaction(s): other   Family History  Problem Relation Age of Onset   Heart attack Father    Lung disease Father    Cancer Maternal Grandmother        Uterine   Heart failure Sister   Mother died of perforated bowel at 26. Father died at 62.  PE: BP 130/64   Pulse  81   Ht 5' 2 (1.575 m)   Wt 166 lb 9.6 oz (75.6 kg)   SpO2 96%   BMI 30.47 kg/m  Wt Readings from Last 15 Encounters:  12/04/23 166 lb 9.6 oz (75.6 kg)  10/02/23 164 lb (74.4 kg)  09/20/23 165 lb (74.8 kg)  06/30/23 155 lb (70.3 kg)  09/12/21 159 lb (72.1 kg)  08/26/21 153 lb 1 oz (69.4 kg)  07/30/21 160 lb (72.6 kg)  02/23/21 159 lb 6.4 oz (72.3 kg)  09/07/20 154 lb (69.9 kg)  02/24/20 146 lb (66.2 kg)  11/19/19 145 lb 12.8 oz (66.1 kg)  08/27/19 151 lb (68.5 kg)  02/24/19 171 lb (77.6 kg)  08/26/18 170 lb (77.1 kg)  08/13/17 169 lb (76.7 kg)   Constitutional: overweight, in NAD Eyes:  EOMI, no exophthalmos ENT: no neck masses, no cervical lymphadenopathy Cardiovascular: RRR, No MRG Respiratory: CTA B Musculoskeletal: no deformities Skin:no rashes Neurological: no tremor with outstretched hands  Assessment: 1. Osteoporosis  2.  Vitamin D  insufficiency  Plan: 1. Osteoporosis -Likely postmenopausal/age-related and she also has family history of osteoporosis in mother, who had a pubic fracture -Since last visit, she had a rib fracture 6 months ago, but we discussed that this is not necessarily a fragility fracture. -I reviewed her latest bone density scan and her T-scores at the level of the right femoral neck were discrepant with the right total hip, but otherwise, her T-scores were stable.  We did discuss that the latest bone density scan was an outlier and we were planning to obtain another DXA scan on the same densitometer at [facility] in 2022, however, she was lost for follow-up afterwards.   She ended up having another bone density scan on 08/09/2023 and her FRAX scores were both elevated.  This DXA scan was obtained on another densitometer, but the results are approximately consistent with the results from 2018 and 2016, not so much the ones from 2020, which appear to have been an outlier. - After discussion with PCP, she ended up starting Prolia  10/18/2023.  She tolerates this well.  It was mentions that she had intolerance to Reclast  in the past (leg pain) and oral bisphosphonates (GERD) - she had bone loss in her jaw, so we discussed in the past that ONJ usually happens with higher doses of osteoporotic medications or more frequent intervals, for example in cancer patients, but can also happen in patients taking the regular doses although much less frequently - We discussed the need to get 2000 to 1200 mg of calcium preferentially from the diet and she continues vitamin D  2000 units daily.  Latest vitamin D  level was normal 08/22/2023 (30) -She is planning to restart weightbearing exercises now that her rib fracture has healed.  She still has pain in her back and also her hips.  -She is not smoking or drinking more than 2 alcoholic drinks a day -She previously lost 25 pounds in 2021 but before our last visit in 2022 she gained some of this back.  She gained 5 pounds since last visit. -At today's visit, we discussed about continuing Prolia , which she appears to be tolerating well >> we discussed about continuing this through OB/GYN's office. -Her latest BMP showed normal kidney function and calcium on 09/20/2023.  She also had a PTH and a calcium level checked earlier this month and they were normal.  We will not repeat these today. -I will see her back as needed  2.  Vitamin  D insufficiency - She continues on 2000 units vitamin D  daily - Latest level was normal in 2022 and she had another value obtained 3 months ago and this was again normal - Will continue the same vitamin D  dose  now  Lela Fendt, MD PhD [facility]

## 2023-12-05 DIAGNOSIS — M25552 Pain in left hip: Secondary | ICD-10-CM | POA: Diagnosis not present

## 2023-12-05 DIAGNOSIS — R262 Difficulty in walking, not elsewhere classified: Secondary | ICD-10-CM | POA: Diagnosis not present

## 2023-12-10 DIAGNOSIS — R262 Difficulty in walking, not elsewhere classified: Secondary | ICD-10-CM | POA: Diagnosis not present

## 2023-12-10 DIAGNOSIS — M25552 Pain in left hip: Secondary | ICD-10-CM | POA: Diagnosis not present

## 2023-12-11 ENCOUNTER — Encounter: Payer: Self-pay | Admitting: Professional Counselor

## 2023-12-11 ENCOUNTER — Ambulatory Visit: Admitting: Professional Counselor

## 2023-12-11 DIAGNOSIS — F4323 Adjustment disorder with mixed anxiety and depressed mood: Secondary | ICD-10-CM | POA: Diagnosis not present

## 2023-12-11 NOTE — Progress Notes (Unsigned)
      [facility] Counselor/Therapist Progress Note  Patient ID: Tracy [name], MRN: [id number],    Date: 12/11/2023  Time Spent: 4:09 PM - 5:17 PM   Treatment Type: Individual Therapy  Reported Symptoms: health concerns, worries, stress, phase of life concerns, career concerns, intermittent low mood, fatigue, anxiousness, trouble relaxing, self esteem concerns, focus/motivation concerns    Mental Status Exam:  Appearance:   Neat     Behavior:  Appropriate, Sharing, and Motivated  Motor:  Normal  Speech/Language:   Clear and Coherent and Normal Rate  Affect:  Appropriate and Congruent  Mood:  normal  Thought process:  normal  Thought content:    WNL  Sensory/Perceptual disturbances:    WNL  Orientation:  oriented to person, place, time/date, and situation  Attention:  Good  Concentration:  Good  Memory:  WNL  Fund of knowledge:   Good  Insight:    Good  Judgment:   Good  Impulse Control:  Good   Risk Assessment: Danger to Self:  No Self-injurious Behavior: No Danger to Others: No Duty to Warn:no Physical Aggression / Violence:No  Access to Firearms a concern: No  Gang Involvement:No   Subjective: Patient presented to session to address concerns of anxiety and depression.  She reported mixed progress at this time.  She reported being worried about her sister's health, and to have had a very difficult time with her own health as relates her gastrointestinal issues and related treatments.  She processed the experience of trauma as relates medical experiences.  She also processed relational history as relates prior marriage in particular, and resulting challenging dynamics within family system as a whole.  Counselor actively listened, affirmed patient feelings and experience, helped to facilitate insight and develop strategies around patient processing and life concerns, and helped to reinforce patient strengths and to identify resources including as relates motivations toward  her current training and cultivating friendships.  Interventions: Solution-Oriented/Positive Psychology, Humanistic/Existential, and Insight-Oriented  Diagnosis:   ICD-10-CM   1. Adjustment disorder with mixed anxiety and depressed mood  F43.23       Plan: Pt is scheduled for a follow-up; continue process work and developing coping skills. STG between sessions to visit friends and sister and prioritize pleasant activities and co-working to help with focus/motivation.  [name], [facility]

## 2023-12-13 DIAGNOSIS — M5417 Radiculopathy, lumbosacral region: Secondary | ICD-10-CM | POA: Diagnosis not present

## 2023-12-13 DIAGNOSIS — M5386 Other specified dorsopathies, lumbar region: Secondary | ICD-10-CM | POA: Diagnosis not present

## 2023-12-13 DIAGNOSIS — M9902 Segmental and somatic dysfunction of thoracic region: Secondary | ICD-10-CM | POA: Diagnosis not present

## 2023-12-13 DIAGNOSIS — M5414 Radiculopathy, thoracic region: Secondary | ICD-10-CM | POA: Diagnosis not present

## 2023-12-13 DIAGNOSIS — M9903 Segmental and somatic dysfunction of lumbar region: Secondary | ICD-10-CM | POA: Diagnosis not present

## 2023-12-13 DIAGNOSIS — M9905 Segmental and somatic dysfunction of pelvic region: Secondary | ICD-10-CM | POA: Diagnosis not present

## 2023-12-25 ENCOUNTER — Encounter: Payer: Self-pay | Admitting: Professional Counselor

## 2023-12-25 ENCOUNTER — Ambulatory Visit (INDEPENDENT_AMBULATORY_CARE_PROVIDER_SITE_OTHER): Admitting: Professional Counselor

## 2023-12-25 DIAGNOSIS — F4323 Adjustment disorder with mixed anxiety and depressed mood: Secondary | ICD-10-CM | POA: Diagnosis not present

## 2023-12-25 NOTE — Progress Notes (Addendum)
      [facility] Counselor/Therapist Progress Note  Patient ID: Tracy [name], MRN: [id number],    Date: 12/25/2023  Time Spent: 4:07 PM to 5:06 PM  Treatment Type: Individual Therapy  Reported Symptoms: Tearfulness, anxiousness, nervousness, dissociation, worries, sadness, phase of life concerns, interpersonal concerns, trouble relaxing, self-esteem concerns, intermittent low mood, health concerns  Mental Status Exam:  Appearance:   Neat     Behavior:  Appropriate and Sharing  Motor:  Normal  Speech/Language:   Clear and Coherent and Normal Rate  Affect:  Tearful  Mood:  sad  Thought process:  normal  Thought content:    WNL  Sensory/Perceptual disturbances:    WNL  Orientation:  oriented to person, place, time/date, and situation  Attention:  Good  Concentration:  Good  Memory:  WNL  Fund of knowledge:   Good  Insight:    Good  Judgment:   Good  Impulse Control:  Good   Risk Assessment: Danger to Self:  No Self-injurious Behavior: No Danger to Others: No Duty to Warn:no Physical Aggression / Violence:No  Access to Firearms a concern: No  Gang Involvement:No   Subjective: Patient presented to session to address concerns of anxiety and depression.  She reported minimal progress at this time.  She reported having been on a trip to visit her sister recently, but to have become more depressed and anxious upon her return.  She identified life circumstances as stressful, including financial stress and need for work.  She reflected on meaning in her life as relates a book of poetry she brought into session.  Patient read poetry with counselor, and processed experience of difficulty in letting herself surrender to her feelings and/or to intimate life and relationship experiences, fear of death, and experiences of dissociation by history, which counselor and patient discussed as a way in which patient has self protected and times of stress and danger.  Counselor and patient  discussed meaning making, and counselor helped facilitate patient reflections and insight into her process of self discovery.    Interventions: Humanistic/Existential, Narrative, and Insight-Oriented  Diagnosis:   ICD-10-CM   1. Adjustment disorder with mixed anxiety and depressed mood  F43.23       Plan: Patient is scheduled for follow-up; continue process work and developing coping skills.  Utilize narrative therapy including use of poetic call and response activity as discussed in session.  STG between sessions for patient to consider therapeutic journaling, continue to prioritize self-care including as relates health concerns.  Tracy [name], [facility]

## 2023-12-27 DIAGNOSIS — M5386 Other specified dorsopathies, lumbar region: Secondary | ICD-10-CM | POA: Diagnosis not present

## 2023-12-27 DIAGNOSIS — M9902 Segmental and somatic dysfunction of thoracic region: Secondary | ICD-10-CM | POA: Diagnosis not present

## 2023-12-27 DIAGNOSIS — M5414 Radiculopathy, thoracic region: Secondary | ICD-10-CM | POA: Diagnosis not present

## 2023-12-27 DIAGNOSIS — M5417 Radiculopathy, lumbosacral region: Secondary | ICD-10-CM | POA: Diagnosis not present

## 2023-12-27 DIAGNOSIS — M9905 Segmental and somatic dysfunction of pelvic region: Secondary | ICD-10-CM | POA: Diagnosis not present

## 2023-12-27 DIAGNOSIS — M9903 Segmental and somatic dysfunction of lumbar region: Secondary | ICD-10-CM | POA: Diagnosis not present

## 2024-01-01 DIAGNOSIS — R42 Dizziness and giddiness: Secondary | ICD-10-CM | POA: Diagnosis not present

## 2024-01-08 ENCOUNTER — Encounter: Payer: Self-pay | Admitting: Professional Counselor

## 2024-01-08 ENCOUNTER — Ambulatory Visit: Admitting: Professional Counselor

## 2024-01-08 DIAGNOSIS — F4323 Adjustment disorder with mixed anxiety and depressed mood: Secondary | ICD-10-CM

## 2024-01-08 NOTE — Progress Notes (Signed)
      [facility] Counselor/Therapist Progress Note  Patient ID: Tracy [name], MRN: [id number],    Date: 01/08/2024  Time Spent: 4:11 PM to 5:07 PM  Treatment Type: Individual Therapy  Reported Symptoms: Somatic pain due to health concerns, low mood, frustration, worries, preoccupying thoughts, phase of life concerns, career concerns, nervousness, anxiousness, trouble relaxing, irritability, fatigue, self-esteem concerns  Mental Status Exam:  Appearance:   Neat     Behavior:  Appropriate and Sharing  Motor:  Normal  Speech/Language:   Clear and Coherent and Normal Rate  Affect:  Depressed  Mood:  depressed  Thought process:  normal  Thought content:    WNL  Sensory/Perceptual disturbances:    WNL  Orientation:  oriented to person, place, time/date, and situation  Attention:  Good  Concentration:  Good  Memory:  WNL  Fund of knowledge:   Good  Insight:    Good  Judgment:   Good  Impulse Control:  Good   Risk Assessment: Danger to Self:  No Self-injurious Behavior: No Danger to Others: No Duty to Warn:no Physical Aggression / Violence:No  Access to Firearms a concern: No  Gang Involvement:No   Subjective: Patient presented to session to address concerns of anxiety and depression.  She reported minimal progress at this time.  She reported continued low mood and anxiety, related to unemployment, her health issues, interpersonal matters, finances, and frustration with her apartment.  She reported to be continuing to work on her certification with hopes to boost her career plans.  Counselor assisted patient in career discernment and with strategies to increase motivation and self-esteem, and helped patient to resource sense of hopefulness and identification with her strengths.  Interventions: Solution-Oriented/Positive Psychology, Humanistic/Existential, Insight-Oriented, and Career Counseling  Diagnosis:   ICD-10-CM   1. Adjustment disorder with mixed anxiety and depressed  mood  F43.23       Plan: Patient is scheduled for follow-up; continue process work and developing coping skills.  STG between sessions to continue to apply for jobs, resource positive self affirmations, prioritize self-care including rest and medical treatment, continue to utilize boundaries in relationships were indicated.  [name], [facility]

## 2024-01-10 DIAGNOSIS — M9905 Segmental and somatic dysfunction of pelvic region: Secondary | ICD-10-CM | POA: Diagnosis not present

## 2024-01-10 DIAGNOSIS — M9903 Segmental and somatic dysfunction of lumbar region: Secondary | ICD-10-CM | POA: Diagnosis not present

## 2024-01-10 DIAGNOSIS — M5414 Radiculopathy, thoracic region: Secondary | ICD-10-CM | POA: Diagnosis not present

## 2024-01-10 DIAGNOSIS — M9902 Segmental and somatic dysfunction of thoracic region: Secondary | ICD-10-CM | POA: Diagnosis not present

## 2024-01-10 DIAGNOSIS — M5417 Radiculopathy, lumbosacral region: Secondary | ICD-10-CM | POA: Diagnosis not present

## 2024-01-10 DIAGNOSIS — M5386 Other specified dorsopathies, lumbar region: Secondary | ICD-10-CM | POA: Diagnosis not present

## 2024-01-15 DIAGNOSIS — M5386 Other specified dorsopathies, lumbar region: Secondary | ICD-10-CM | POA: Diagnosis not present

## 2024-01-15 DIAGNOSIS — M9905 Segmental and somatic dysfunction of pelvic region: Secondary | ICD-10-CM | POA: Diagnosis not present

## 2024-01-15 DIAGNOSIS — M5414 Radiculopathy, thoracic region: Secondary | ICD-10-CM | POA: Diagnosis not present

## 2024-01-15 DIAGNOSIS — M5417 Radiculopathy, lumbosacral region: Secondary | ICD-10-CM | POA: Diagnosis not present

## 2024-01-15 DIAGNOSIS — M9903 Segmental and somatic dysfunction of lumbar region: Secondary | ICD-10-CM | POA: Diagnosis not present

## 2024-01-15 DIAGNOSIS — M9902 Segmental and somatic dysfunction of thoracic region: Secondary | ICD-10-CM | POA: Diagnosis not present

## 2024-01-23 ENCOUNTER — Ambulatory Visit (INDEPENDENT_AMBULATORY_CARE_PROVIDER_SITE_OTHER): Admitting: Professional Counselor

## 2024-01-23 ENCOUNTER — Encounter: Payer: Self-pay | Admitting: Professional Counselor

## 2024-01-23 DIAGNOSIS — F4323 Adjustment disorder with mixed anxiety and depressed mood: Secondary | ICD-10-CM | POA: Diagnosis not present

## 2024-01-23 DIAGNOSIS — M5386 Other specified dorsopathies, lumbar region: Secondary | ICD-10-CM | POA: Diagnosis not present

## 2024-01-23 DIAGNOSIS — M9905 Segmental and somatic dysfunction of pelvic region: Secondary | ICD-10-CM | POA: Diagnosis not present

## 2024-01-23 DIAGNOSIS — M9903 Segmental and somatic dysfunction of lumbar region: Secondary | ICD-10-CM | POA: Diagnosis not present

## 2024-01-23 DIAGNOSIS — M5414 Radiculopathy, thoracic region: Secondary | ICD-10-CM | POA: Diagnosis not present

## 2024-01-23 DIAGNOSIS — M5417 Radiculopathy, lumbosacral region: Secondary | ICD-10-CM | POA: Diagnosis not present

## 2024-01-23 DIAGNOSIS — M9902 Segmental and somatic dysfunction of thoracic region: Secondary | ICD-10-CM | POA: Diagnosis not present

## 2024-01-23 NOTE — Progress Notes (Unsigned)
      [facility] Counselor/Therapist Progress Note  Patient ID: MARYJAYNE KLEVEN, MRN: [id number],    Date: 01/23/2024  Time Spent:  4:07 PM - 5:55 PM  Treatment Type: Individual Therapy  Reported Symptoms: worries, low mood, trouble concentrating, irritability, anxiousness, fatigue, trouble relaxing, career concerns, phase of life concerns, health concerns   Mental Status Exam:  Appearance:   Casual     Behavior:  Appropriate and Sharing  Motor:  Normal  Speech/Language:   Clear and Coherent and Normal Rate  Affect:  Appropriate and Congruent  Mood:  normal  Thought process:  normal  Thought content:    WNL  Sensory/Perceptual disturbances:    WNL  Orientation:  oriented to person, place, time/date, and situation  Attention:  Good  Concentration:  Good  Memory:  WNL  Fund of knowledge:   Good  Insight:    Good  Judgment:   Good  Impulse Control:  Good   Risk Assessment: Danger to Self:  No Self-injurious Behavior: No Danger to Others: No Duty to Warn:no Physical Aggression / Violence:No  Access to Firearms a concern: No  Gang Involvement:No   Subjective: ***   Interventions: Solution-Oriented/Positive Psychology, Humanistic/Existential, and Insight-Oriented  Diagnosis:   ICD-10-CM   1. Adjustment disorder with mixed anxiety and depressed mood  F43.23       Plan: Pt is scheduled for a follow-up; continue process work and developing coping skills. STG between sessions to rest, relax, prioritize self care and medical treatments; continue job search.  [name], [facility]

## 2024-01-25 ENCOUNTER — Ambulatory Visit: Admitting: Internal Medicine

## 2024-01-29 ENCOUNTER — Ambulatory Visit: Attending: Gastroenterology

## 2024-01-29 ENCOUNTER — Other Ambulatory Visit: Payer: Self-pay

## 2024-01-29 DIAGNOSIS — R279 Unspecified lack of coordination: Secondary | ICD-10-CM | POA: Diagnosis not present

## 2024-01-29 DIAGNOSIS — M62838 Other muscle spasm: Secondary | ICD-10-CM | POA: Insufficient documentation

## 2024-01-29 DIAGNOSIS — M6281 Muscle weakness (generalized): Secondary | ICD-10-CM | POA: Insufficient documentation

## 2024-01-29 DIAGNOSIS — R1011 Right upper quadrant pain: Secondary | ICD-10-CM | POA: Diagnosis not present
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
## 2024-01-29 NOTE — Patient Instructions (Signed)
 Squatty potty: When your knees are level or below the level of your hips, pelvic floor muscles are pressed against rectum, preventing ease of bowel movement. By getting knees above the level of the hips, these pelvic floor muscles relax, allowing easier passage of bowel movement. Ways to get knees above hips: o Squatty Potty (7inch and 9inch versions) o Small stool o Roll of toilet paper under each foot o Hardback book or stack of magazines under each foot  Relaxed Toileting mechanics: Once in this position, make sure to lean forward with forearms on thighs, wide knees, relaxed stomach, and breathe. Try to exhale like you are blowing out through a straw or blowing up a tight balloon  Try splinting: brace with toilet paper over vaginal opening or between vaginal opening and anus and apply gentle pressure    Bowel massage: To assist with more regular and more comfortable bowel movements, try performing bowel massage nightly for 5-10 minutes. Place hands in the lower right side of your abdomen to start; in small circles, massage up, across, and down the left side of your abdomen. Pressure does not need to be hard, but just comfortable. You can use lotion or oil to make more comfortable.    [facility] [address], [address] [place], [place] [address] Phone # [phone] Fax [phone]

## 2024-01-30 DIAGNOSIS — M9905 Segmental and somatic dysfunction of pelvic region: Secondary | ICD-10-CM | POA: Diagnosis not present

## 2024-01-30 DIAGNOSIS — M5414 Radiculopathy, thoracic region: Secondary | ICD-10-CM | POA: Diagnosis not present

## 2024-01-30 DIAGNOSIS — M9903 Segmental and somatic dysfunction of lumbar region: Secondary | ICD-10-CM | POA: Diagnosis not present

## 2024-01-30 DIAGNOSIS — M5386 Other specified dorsopathies, lumbar region: Secondary | ICD-10-CM | POA: Diagnosis not present

## 2024-01-30 DIAGNOSIS — M9902 Segmental and somatic dysfunction of thoracic region: Secondary | ICD-10-CM | POA: Diagnosis not present

## 2024-01-30 DIAGNOSIS — M5417 Radiculopathy, lumbosacral region: Secondary | ICD-10-CM | POA: Diagnosis not present

## 2024-02-05 DIAGNOSIS — M9902 Segmental and somatic dysfunction of thoracic region: Secondary | ICD-10-CM | POA: Diagnosis not present

## 2024-02-05 DIAGNOSIS — M5386 Other specified dorsopathies, lumbar region: Secondary | ICD-10-CM | POA: Diagnosis not present

## 2024-02-05 DIAGNOSIS — M5414 Radiculopathy, thoracic region: Secondary | ICD-10-CM | POA: Diagnosis not present

## 2024-02-05 DIAGNOSIS — M9905 Segmental and somatic dysfunction of pelvic region: Secondary | ICD-10-CM | POA: Diagnosis not present

## 2024-02-05 DIAGNOSIS — M5417 Radiculopathy, lumbosacral region: Secondary | ICD-10-CM | POA: Diagnosis not present

## 2024-02-05 DIAGNOSIS — M9903 Segmental and somatic dysfunction of lumbar region: Secondary | ICD-10-CM | POA: Diagnosis not present

## 2024-02-07 ENCOUNTER — Ambulatory Visit: Attending: Gastroenterology

## 2024-02-07 DIAGNOSIS — M6281 Muscle weakness (generalized): Secondary | ICD-10-CM | POA: Insufficient documentation

## 2024-02-07 DIAGNOSIS — R1011 Right upper quadrant pain: Secondary | ICD-10-CM | POA: Diagnosis not present

## 2024-02-07 DIAGNOSIS — M62838 Other muscle spasm: Secondary | ICD-10-CM | POA: Diagnosis not present

## 2024-02-07 DIAGNOSIS — R279 Unspecified lack of coordination: Secondary | ICD-10-CM | POA: Insufficient documentation
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
## 2024-02-08 ENCOUNTER — Emergency Department (HOSPITAL_BASED_OUTPATIENT_CLINIC_OR_DEPARTMENT_OTHER)

## 2024-02-08 ENCOUNTER — Encounter (HOSPITAL_BASED_OUTPATIENT_CLINIC_OR_DEPARTMENT_OTHER): Payer: Self-pay | Admitting: Emergency Medicine

## 2024-02-08 ENCOUNTER — Emergency Department (HOSPITAL_BASED_OUTPATIENT_CLINIC_OR_DEPARTMENT_OTHER)
Admission: EM | Admit: 2024-02-08 | Discharge: 2024-02-08 | Disposition: A | Discharge status: Home / Self Care | Attending: Emergency Medicine | Admitting: Emergency Medicine

## 2024-02-08 ENCOUNTER — Other Ambulatory Visit: Payer: Self-pay

## 2024-02-08 DIAGNOSIS — R1010 Upper abdominal pain, unspecified: Secondary | ICD-10-CM | POA: Diagnosis not present

## 2024-02-08 DIAGNOSIS — K5730 Diverticulosis of large intestine without perforation or abscess without bleeding: Secondary | ICD-10-CM | POA: Diagnosis not present

## 2024-02-08 DIAGNOSIS — R140 Abdominal distension (gaseous): Secondary | ICD-10-CM | POA: Diagnosis not present

## 2024-02-08 DIAGNOSIS — D72829 Elevated white blood cell count, unspecified: Secondary | ICD-10-CM | POA: Diagnosis not present

## 2024-02-08 DIAGNOSIS — R1013 Epigastric pain: Secondary | ICD-10-CM | POA: Insufficient documentation

## 2024-02-08 DIAGNOSIS — K588 Other irritable bowel syndrome: Secondary | ICD-10-CM | POA: Diagnosis not present

## 2024-02-08 DIAGNOSIS — K5792 Diverticulitis of intestine, part unspecified, without perforation or abscess without bleeding: Secondary | ICD-10-CM | POA: Diagnosis not present

## 2024-02-08 LAB — URINALYSIS, ROUTINE W REFLEX MICROSCOPIC
Bilirubin Urine: NEGATIVE
Glucose, UA: NEGATIVE mg/dL
Hgb urine dipstick: NEGATIVE
Ketones, ur: NEGATIVE mg/dL
Nitrite: NEGATIVE
Protein, ur: NEGATIVE mg/dL
Specific Gravity, Urine: 1.015 (ref 1.005–1.030)
pH: 7 (ref 5.0–8.0)

## 2024-02-08 LAB — CBC WITH DIFFERENTIAL/PLATELET
Abs Immature Granulocytes: 0.02 K/uL (ref 0.00–0.07)
Basophils Absolute: 0 K/uL (ref 0.0–0.1)
Basophils Relative: 1 %
Eosinophils Absolute: 0.1 K/uL (ref 0.0–0.5)
Eosinophils Relative: 1 %
HCT: 35.3 % — ABNORMAL LOW (ref 36.0–46.0)
Hemoglobin: 12 g/dL (ref 12.0–15.0)
Immature Granulocytes: 0 %
Lymphocytes Relative: 17 %
Lymphs Abs: 1.3 K/uL (ref 0.7–4.0)
MCH: 30.5 pg (ref 26.0–34.0)
MCHC: 34 g/dL (ref 30.0–36.0)
MCV: 89.6 fL (ref 80.0–100.0)
Monocytes Absolute: 0.6 K/uL (ref 0.1–1.0)
Monocytes Relative: 7 %
Neutro Abs: 5.5 K/uL (ref 1.7–7.7)
Neutrophils Relative %: 74 %
Platelets: 232 K/uL (ref 150–400)
RBC: 3.94 MIL/uL (ref 3.87–5.11)
RDW: 14.3 % (ref 11.5–15.5)
WBC: 7.5 K/uL (ref 4.0–10.5)
nRBC: 0 % (ref 0.0–0.2)

## 2024-02-08 LAB — COMPREHENSIVE METABOLIC PANEL WITH GFR
ALT: 13 U/L (ref 0–44)
AST: 20 U/L (ref 15–41)
Albumin: 4 g/dL (ref 3.5–5.0)
Alkaline Phosphatase: 81 U/L (ref 38–126)
Anion gap: 11 (ref 5–15)
BUN: 17 mg/dL (ref 8–23)
CO2: 21 mmol/L — ABNORMAL LOW (ref 22–32)
Calcium: 10.2 mg/dL (ref 8.9–10.3)
Chloride: 105 mmol/L (ref 98–111)
Creatinine, Ser: 0.71 mg/dL (ref 0.44–1.00)
GFR, Estimated: >60 mL/min (ref >60)
Glucose, Bld: 113 mg/dL — ABNORMAL HIGH (ref 70–99)
Potassium: 3.9 mmol/L (ref 3.5–5.1)
Sodium: 137 mmol/L (ref 135–145)
Total Bilirubin: 0.5 mg/dL (ref 0.0–1.2)
Total Protein: 6.9 g/dL (ref 6.5–8.1)

## 2024-02-08 LAB — LIPASE, BLOOD: Lipase: 43 U/L (ref 11–51)

## 2024-02-08 LAB — TROPONIN T, HIGH SENSITIVITY
Troponin T High Sensitivity: <15 ng/L (ref 0–19)
Troponin T High Sensitivity: <15 ng/L (ref 0–19)

## 2024-02-08 MED ORDER — IOHEXOL 300 MG/ML  SOLN
100.0000 mL | Freq: Once | INTRAMUSCULAR | Status: AC | PRN
  Administered 2024-02-08: 100 mL via INTRAVENOUS

## 2024-02-08 MED ORDER — ALUM & MAG HYDROXIDE-SIMETH 200-200-20 MG/5ML PO SUSP
30.0000 mL | Freq: Once | ORAL | Status: AC
  Administered 2024-02-08: 30 mL via ORAL
  Filled 2024-02-08: qty 30

## 2024-02-08 MED ORDER — DICYCLOMINE HCL 20 MG PO TABS: 20.0000 mg | ORAL_TABLET | Freq: Two times a day (BID) | ORAL | 0 refills | Status: AC

## 2024-02-08 NOTE — Discharge Instructions (Addendum)
 You have been evaluated for your abdominal discomfort.  Fortunately CT scan today did not show any concerning findings.  Your heart enzymes are normal.  Please take Bentyl  for your symptoms but follow-up closely with GI specialist for outpatient evaluation and management of your condition.  Return if you have any concern.

## 2024-02-08 NOTE — ED Provider Notes (Signed)
 [place] EMERGENCY DEPARTMENT AT [facility] Provider Note   CSN: [id number] Arrival date & time: 02/08/24  1021     Patient presents with: Abdominal Pain   Tracy [name] is a 73 y.o. female.   The history is provided by the patient and medical records. No language interpreter was used.  Abdominal Pain    73 year old female history of IBS, diverticular disease, dyssynergic defecation presenting with complaint of abdominal pain.  Patient states for the past 2 to 3 days she has noticed pain across her upper abdomen.  Pain is described as a bloating sensation and tightness more noticeable in the morning.  She is currently receiving physical therapy for pelvic floor syndrome and she mention about her discomfort I would recommend to go to the ER for evaluation.  And patient denies any history of cardiac disease.  Remote history of GERD.  She denies any fever or chills at the chest pain shortness of breath productive cough nausea vomiting diarrhea.  Denies any recent cardiac workup.  Denies any significant pain at this time.  She attributed her symptoms to eating fairly unhealthy for the past few days as well as consuming some alcohol which she normally does not drink but [birth year] was her birthday.  Prior to Admission medications   Medication Sig Start Date End Date Taking? Authorizing Provider  amLODipine (NORVASC) 2.5 MG tablet Take 1 tablet by mouth daily.    [provider]  Cholecalciferol (VITAMIN D  PO) Take 2,000 Int'l Units/day by mouth.    [provider]  Cholecalciferol 50 MCG (2000 UT) CAPS Take by mouth.    [provider]  Cyanocobalamin (VITAMIN B-12 PO) Take by mouth.    [provider]  cycloSPORINE (RESTASIS) 0.05 % ophthalmic emulsion 1 drop 2 (two) times daily.    [provider]  Flaxseed, Linseed, (FLAX SEEDS PO) Take by mouth.    [provider]  telmisartan (MICARDIS) 80 MG tablet Take 80 mg by mouth daily.  07/11/19   [provider]  valACYclovir  (VALTREX ) 500 MG tablet Take 1 tablet (500 mg total) by mouth 2 (two) times daily. For 5 days with outbreaks 08/14/23   Prentiss Riggs A, NP    Allergies: Fluoxetine, Other, Sulfa  antibiotics, and Zoledronic  acid    Review of Systems  Gastrointestinal:  Positive for abdominal pain.  All other systems reviewed and are negative.   Updated Vital Signs BP (!) 174/90   Pulse (!) 103   Ht 5' 2 (1.575 m)   Wt 72.6 kg   SpO2 98%   BMI 29.26 kg/m   Physical Exam Vitals and nursing note reviewed.  Constitutional:      General: She is not in acute distress.    Appearance: She is well-developed.  HENT:     Head: Atraumatic.  Eyes:     Conjunctiva/sclera: Conjunctivae normal.  Cardiovascular:     Rate and Rhythm: Normal rate and regular rhythm.     Pulses: Normal pulses.     Heart sounds: Normal heart sounds.  Pulmonary:     Effort: Pulmonary effort is normal.  Chest:     Chest wall: No tenderness.  Abdominal:     Palpations: Abdomen is soft.     Tenderness: There is no abdominal tenderness.  Musculoskeletal:     Cervical back: Neck supple.  Skin:    Findings: No rash.  Neurological:     Mental Status: She is alert.  Psychiatric:  Mood and Affect: Mood normal.     (all labs ordered are listed, but only abnormal results are displayed) Labs Reviewed  CBC WITH DIFFERENTIAL/PLATELET - Abnormal; Notable for the following components:      Result Value   HCT 35.3 (*)    All other components within normal limits  COMPREHENSIVE METABOLIC PANEL WITH GFR - Abnormal; Notable for the following components:   CO2 21 (*)    Glucose, Bld 113 (*)    All other components within normal limits  URINALYSIS, ROUTINE W REFLEX MICROSCOPIC - Abnormal; Notable for the following components:   Leukocytes,Ua MODERATE (*)    Bacteria, UA RARE (*)    All other components within normal limits  LIPASE, BLOOD  TROPONIN T, HIGH SENSITIVITY   TROPONIN T, HIGH SENSITIVITY    EKG: EKG Interpretation Date/Time:  Friday February 08 2024 10:32:32 EDT Ventricular Rate:  90 PR Interval:  198 QRS Duration:  92 QT Interval:  354 QTC Calculation: 434 R Axis:   -2  Text Interpretation: Sinus rhythm Low voltage, precordial leads Consider anterior infarct Confirmed by Mannie Pac [phone]) on 02/08/2024 1:36:21 PM  Radiology: CT ABDOMEN PELVIS W CONTRAST Result Date: 02/08/2024 EXAM: CT ABDOMEN AND PELVIS WITH CONTRAST 02/08/2024 12:28:47 PM TECHNIQUE: CT of the abdomen and pelvis was performed with the administration of intravenous contrast. Multiplanar reformatted images are provided for review. Automated exposure control, iterative reconstruction, and/or weight-based adjustment of the mA/kV was utilized to reduce the radiation dose to as low as reasonably achievable. COMPARISON: 09/20/2023 CLINICAL HISTORY: Abdominal pain, acute, nonlocalized. Epigastric pain x 2 days, history of IBS, diverticulitis, appendectomy, colon surgery, rectal manometry. FINDINGS: LOWER CHEST: No acute abnormality. LIVER: The liver is unremarkable. GALLBLADDER AND BILE DUCTS: Gallbladder is unremarkable. No biliary ductal dilatation. SPLEEN: No acute abnormality. PANCREAS: No acute abnormality. ADRENAL GLANDS: No acute abnormality. KIDNEYS, URETERS AND BLADDER: No stones in the kidneys or ureters. No hydronephrosis. Collapsed urinary bladder is unremarkable. GI AND BOWEL: Appendectomy. No dilated or thick walled small bowel loops. Marked left colonic diverticulosis with no large bowel wall thickening or pericolonic fat stranding. Surgical suture line noted in the distal colon. Stomach demonstrates no acute abnormality. PERITONEUM AND RETROPERITONEUM: No ascites. No free air. VASCULATURE: Atherosclerotic abdominal aorta is normal in caliber. LYMPH NODES: No lymphadenopathy. REPRODUCTIVE ORGANS: No acute abnormality. BONES AND SOFT TISSUES: Moderate thoracolumbar  spondylosis. No acute osseous abnormality. No focal soft tissue abnormality. IMPRESSION: 1. No acute findings in the abdomen or pelvis. 2. Marked left colonic diverticulosis without evidence of diverticulitis. Electronically signed by: Selinda Blue MD 02/08/2024 12:48 PM EDT RP Workstation: [id number]   DG Chest Portable 1 View Result Date: 02/08/2024 CLINICAL DATA:  Upper abdominal pain. EXAM: PORTABLE CHEST 1 VIEW COMPARISON:  June 30, 2023. FINDINGS: The heart size and mediastinal contours are within normal limits. Both lungs are clear. The visualized skeletal structures are unremarkable. IMPRESSION: No active disease. Electronically Signed   By: Lynwood Landy Raddle M.D.   On: 02/08/2024 11:28     Procedures   Medications Ordered in the ED  alum & mag hydroxide-simeth (MAALOX/MYLANTA) 200-200-20 MG/5ML suspension 30 mL (30 mLs Oral Given 02/08/24 1053)  iohexol  (OMNIPAQUE ) 300 MG/ML solution 100 mL (100 mLs Intravenous Contrast Given 02/08/24 1219)                                    Medical Decision Making Amount and/or Complexity  of Data Reviewed Labs: ordered. Radiology: ordered.  Risk OTC drugs. Prescription drug management.   BP (!) 174/90   Pulse (!) 103   Temp 97.9 F (36.6 C) (Oral)   Resp 20   Ht 5' 2 (1.575 m)   Wt 72.6 kg   SpO2 98%   BMI 29.26 kg/m   1:33 AM  73 year old female history of IBS, diverticular disease, dyssynergic defecation presenting with complaint of abdominal pain.  Patient states for the past 2 to 3 days she has noticed pain across her upper abdomen.  Pain is described as a bloating sensation and tightness more noticeable in the morning.  She is currently receiving physical therapy for pelvic floor syndrome and she mention about her discomfort I would recommend to go to the ER for evaluation.  And patient denies any history of cardiac disease.  Remote history of GERD.  She denies any fever or chills at the chest pain shortness of breath productive  cough nausea vomiting diarrhea.  Denies any recent cardiac workup.  Denies any significant pain at this time.  She attributed her symptoms to eating fairly unhealthy for the past few days as well as consuming some alcohol which she normally does not drink but it was her birthday.  Exam overall reassuring patient is resting comfortably appears to be in no acute discomfort, heart with normal rate rhythm, lungs are clear abdomen is soft and no reproducible tenderness.  -Labs ordered, independently viewed and interpreted by me.  Labs remarkable for urinalysis shows moderate leukocytes and rare bacteria however patient without any true urinary symptoms.  Will send for urine culture.  Negative delta troponin, EKG without acute ischemic changes doubt cardiac source. -The patient was maintained on a cardiac monitor.  I personally viewed and interpreted the cardiac monitored which showed an underlying rhythm of: Normal sinus rhythm -Imaging independently viewed and interpreted by me and I agree with radiologist's interpretation.  Result remarkable for chest x-ray as well as abdominal pelvis CT scan without any acute finding. -This patient presents to the ED for concern of upper abdominal pain, this involves an extensive number of treatment options, and is a complaint that carries with it a high risk of complications and morbidity.  The differential diagnosis includes gastritis, GERD, PUD, pneumonia, ACS -Co morbidities that complicate the patient evaluation includes IBS, diverticular disease -Treatment includes GI cocktail -Reevaluation of the patient after these medicines showed that the patient stayed the same -PCP office notes or outside notes reviewed -Discussion with attending Dr. Mannie -Escalation to admission/observation considered: patients feels much better, is comfortable with discharge, and will follow up with GI specialist -Prescription medication considered, patient comfortable with  bentyl  -Social Determinant of Health considered       Final diagnoses:  Epigastric pain    ED Discharge Orders          Ordered    dicyclomine  (BENTYL ) 20 MG tablet  2 times daily        02/08/24 1345               Nivia Colon, PA-C 02/08/24 1346    Mannie Pac T, DO 02/12/24 [phone]

## 2024-02-08 NOTE — ED Triage Notes (Signed)
 Pt caox4 ambulatory c/o epigastric abd pain stating for the past 2 mornings she has gotten a stabbing pain, LBM this morning. Called her PCP who recommended she be seen to get cardiac work up. Denies pain at present.

## 2024-02-11 ENCOUNTER — Telehealth: Payer: Self-pay | Admitting: Gastroenterology

## 2024-02-11 NOTE — Telephone Encounter (Signed)
 Patient requesting to transfer care to nancy mcgreal from Dr Man . Inquiring about 2nd opinion Advised to have most recent history from April of 2025 faxed for medical review

## 2024-02-13 DIAGNOSIS — Z23 Encounter for immunization: Secondary | ICD-10-CM | POA: Diagnosis not present

## 2024-02-14 ENCOUNTER — Ambulatory Visit: Admitting: Physical Therapy

## 2024-02-14 DIAGNOSIS — R279 Unspecified lack of coordination: Secondary | ICD-10-CM | POA: Diagnosis not present

## 2024-02-14 DIAGNOSIS — R1011 Right upper quadrant pain: Secondary | ICD-10-CM | POA: Diagnosis not present

## 2024-02-14 DIAGNOSIS — M62838 Other muscle spasm: Secondary | ICD-10-CM | POA: Diagnosis not present

## 2024-02-14 DIAGNOSIS — M6281 Muscle weakness (generalized): Secondary | ICD-10-CM | POA: Diagnosis not present

## 2024-02-14 NOTE — Patient Instructions (Signed)
 How to breathe when passing bowel movements for optimal emptying: Sit all the way down, feet flat on the floor or on your yoga blocks  If moving your foot position helps with passing Bms, do this! Avoid straining if you can - imagine blowing as you go instead of creating tension  When you feel the stool sinking down the rectal canal, imagine you are fogging up a mirror as you exhale and gently push the stool down and out  When you feel stuck, try doing pelvic tilts gently to see if you can wiggle the impacted stool out
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
## 2024-02-18 ENCOUNTER — Encounter: Payer: Self-pay | Admitting: Professional Counselor

## 2024-02-18 ENCOUNTER — Ambulatory Visit: Admitting: Professional Counselor

## 2024-02-18 DIAGNOSIS — F4323 Adjustment disorder with mixed anxiety and depressed mood: Secondary | ICD-10-CM
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
## 2024-02-19 NOTE — Telephone Encounter (Signed)
 Good afternoon Dr. Suzann,   Patient called stating that she had her previous gastro records sent over from Dr. Nola office. She was recently in April of 2025. Patient is requesting to transfer over to you. She states that she believes she is having symptoms of gastritis, she also states she has a history of  diverticulosis. Patients records are under the media tab, will you please review and advise on scheduling patient?  Thank you

## 2024-02-28 ENCOUNTER — Ambulatory Visit: Payer: Self-pay | Admitting: Physical Therapy

## 2024-02-28 DIAGNOSIS — R279 Unspecified lack of coordination: Secondary | ICD-10-CM

## 2024-02-28 DIAGNOSIS — R1011 Right upper quadrant pain: Secondary | ICD-10-CM | POA: Diagnosis not present

## 2024-02-28 DIAGNOSIS — M6281 Muscle weakness (generalized): Secondary | ICD-10-CM

## 2024-02-28 DIAGNOSIS — M62838 Other muscle spasm: Secondary | ICD-10-CM | POA: Diagnosis not present
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
## 2024-02-28 NOTE — Telephone Encounter (Signed)
 Left detailed  message advising patient of previous transfer.

## 2024-03-03 ENCOUNTER — Encounter: Payer: Self-pay | Admitting: Professional Counselor

## 2024-03-03 ENCOUNTER — Ambulatory Visit: Admitting: Professional Counselor

## 2024-03-03 DIAGNOSIS — F4323 Adjustment disorder with mixed anxiety and depressed mood: Secondary | ICD-10-CM
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
## 2024-03-13 ENCOUNTER — Ambulatory Visit: Attending: Gastroenterology

## 2024-03-13 DIAGNOSIS — R279 Unspecified lack of coordination: Secondary | ICD-10-CM | POA: Insufficient documentation

## 2024-03-13 DIAGNOSIS — M62838 Other muscle spasm: Secondary | ICD-10-CM | POA: Diagnosis present

## 2024-03-13 DIAGNOSIS — M6281 Muscle weakness (generalized): Secondary | ICD-10-CM | POA: Diagnosis present

## 2024-03-13 DIAGNOSIS — R1011 Right upper quadrant pain: Secondary | ICD-10-CM | POA: Diagnosis present
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
## 2024-03-20 ENCOUNTER — Ambulatory Visit

## 2024-03-20 DIAGNOSIS — M62838 Other muscle spasm: Secondary | ICD-10-CM

## 2024-03-20 DIAGNOSIS — M6281 Muscle weakness (generalized): Secondary | ICD-10-CM | POA: Diagnosis not present

## 2024-03-20 DIAGNOSIS — R1011 Right upper quadrant pain: Secondary | ICD-10-CM

## 2024-03-20 DIAGNOSIS — R279 Unspecified lack of coordination: Secondary | ICD-10-CM
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
## 2024-03-27 ENCOUNTER — Ambulatory Visit: Admitting: Physical Therapy

## 2024-03-27 DIAGNOSIS — K581 Irritable bowel syndrome with constipation: Secondary | ICD-10-CM | POA: Diagnosis not present

## 2024-03-27 DIAGNOSIS — M62838 Other muscle spasm: Secondary | ICD-10-CM

## 2024-03-27 DIAGNOSIS — M6281 Muscle weakness (generalized): Secondary | ICD-10-CM

## 2024-03-27 DIAGNOSIS — R5383 Other fatigue: Secondary | ICD-10-CM | POA: Diagnosis not present

## 2024-03-27 DIAGNOSIS — R279 Unspecified lack of coordination: Secondary | ICD-10-CM
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
## 2024-04-03 ENCOUNTER — Ambulatory Visit: Admitting: Physical Therapy

## 2024-04-03 DIAGNOSIS — M62838 Other muscle spasm: Secondary | ICD-10-CM

## 2024-04-03 DIAGNOSIS — M6281 Muscle weakness (generalized): Secondary | ICD-10-CM | POA: Diagnosis not present

## 2024-04-03 DIAGNOSIS — R279 Unspecified lack of coordination: Secondary | ICD-10-CM

## 2024-04-03 NOTE — Patient Instructions (Addendum)
 Miralax routine: Try taking miralax in some capacity daily if you can. This can be your chews or your powder. I would recommend the following: 1/2 capful daily with water in the evenings  Start following this regimen for 1 full week  Water intake: 1/2 of your body weight in fluid ounces = 80 oz  First thing when you wake up, take your medication with an 8 oz glass of water  Try to have some water nearby when you are eating your meal and drinking your coffee  Set you a mid morning water alarm for 10 AM: try to drink some water at this time  Set you a mid afternoon water alarm for 3 PM: try to drink some water at this time
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
## 2024-04-09 ENCOUNTER — Other Ambulatory Visit: Payer: Self-pay | Admitting: *Deleted

## 2024-04-09 ENCOUNTER — Encounter: Payer: Self-pay | Admitting: *Deleted

## 2024-04-09 ENCOUNTER — Ambulatory Visit: Attending: Gastroenterology

## 2024-04-09 ENCOUNTER — Other Ambulatory Visit (HOSPITAL_COMMUNITY): Payer: Self-pay

## 2024-04-09 ENCOUNTER — Telehealth: Payer: Self-pay

## 2024-04-09 DIAGNOSIS — R1011 Right upper quadrant pain: Secondary | ICD-10-CM | POA: Insufficient documentation

## 2024-04-09 DIAGNOSIS — R279 Unspecified lack of coordination: Secondary | ICD-10-CM | POA: Insufficient documentation

## 2024-04-09 DIAGNOSIS — M62838 Other muscle spasm: Secondary | ICD-10-CM | POA: Insufficient documentation

## 2024-04-09 DIAGNOSIS — M6281 Muscle weakness (generalized): Secondary | ICD-10-CM | POA: Insufficient documentation

## 2024-04-09 DIAGNOSIS — M810 Age-related osteoporosis without current pathological fracture: Secondary | ICD-10-CM

## 2024-04-09 MED ORDER — DENOSUMAB 60 MG/ML ~~LOC~~ SOSY
60.0000 mg | PREFILLED_SYRINGE | Freq: Once | SUBCUTANEOUS | 0 refills | Status: DC
  Filled 2024-04-15: qty 1, 1d supply, fill #0

## 2024-04-09 MED ORDER — DENOSUMAB 60 MG/ML ~~LOC~~ SOSY: 60.0000 mg | PREFILLED_SYRINGE | SUBCUTANEOUS | Status: AC

## 2024-04-09 NOTE — Telephone Encounter (Signed)
 Tracy [name]
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
## 2024-04-10 ENCOUNTER — Other Ambulatory Visit (HOSPITAL_COMMUNITY): Payer: Self-pay

## 2024-04-10 NOTE — Telephone Encounter (Signed)
 MTDM Appt Request Cancelled

## 2024-04-10 NOTE — Telephone Encounter (Signed)
 Pt ready for scheduling for PROLIA  on or after : 04/26/24 - PROCEED WITH MEDICAL BENEFIT  Option# 1: Buy/Bill (Office supplied medication)  Out-of-pocket cost due at time of clinic visit: $362  Number of injection/visits approved: 2  Primary: [employer] Prolia  co-insurance: 20% Admin fee co-insurance: $30   Secondary: --- Prolia  co-insurance:  Admin fee co-insurance:   Medical Benefit Details: Date Benefits were checked: 04/09/24 Deductible: NO/ Coinsurance: 20%/ Admin Fee: $30  Prior Auth: APPROVED PA# [id number] Expiration Date: 08/07/23-08/06/24  # of doses approved: 2 ----------------------------------------------------------------------- Option# 2- Med Obtained from pharmacy: Prolia  is no longer preferred for pharmacy benefit. Jubbonti is now preferred. PRICING IS FOR JUBBONTI  Pharmacy benefit: Copay $711.33 (Paid to pharmacy) Admin Fee: $30 (Pay at clinic)  Prior Auth: N/A PA# Expiration Date:   # of doses approved:   If patient wants fill through the pharmacy benefit please send prescription to: [facility], and include estimated need by date in rx notes. Pharmacy will ship medication directly to the office.  Patient NOT eligible for Prolia  Copay Card. Copay Card can make patient's cost as little as $25. Link to apply: [email or web address]  ** This summary of benefits is an estimation of the patient's out-of-pocket cost. Exact cost may very based on individual plan coverage.

## 2024-04-15 ENCOUNTER — Other Ambulatory Visit (HOSPITAL_COMMUNITY): Payer: Self-pay

## 2024-04-15 ENCOUNTER — Other Ambulatory Visit: Payer: Self-pay

## 2024-04-17 ENCOUNTER — Ambulatory Visit

## 2024-04-17 DIAGNOSIS — M62838 Other muscle spasm: Secondary | ICD-10-CM

## 2024-04-17 DIAGNOSIS — R1011 Right upper quadrant pain: Secondary | ICD-10-CM

## 2024-04-17 DIAGNOSIS — R279 Unspecified lack of coordination: Secondary | ICD-10-CM

## 2024-04-17 DIAGNOSIS — M6281 Muscle weakness (generalized): Secondary | ICD-10-CM
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
## 2024-04-21 ENCOUNTER — Encounter: Payer: Self-pay | Admitting: Professional Counselor

## 2024-04-21 ENCOUNTER — Ambulatory Visit: Admitting: Professional Counselor

## 2024-04-21 DIAGNOSIS — F4323 Adjustment disorder with mixed anxiety and depressed mood: Secondary | ICD-10-CM

## 2024-04-21 NOTE — Progress Notes (Signed)
°      [facility] Counselor/Therapist Progress Note  Patient ID: Tracy [name], MRN: [id number],    Date: 04/21/2024  Time Spent: 11:19 AM to 12:16 PM  Treatment Type: Individual Therapy  Reported Symptoms: Sadness, worries, health concerns, loneliness, low mood, anhedonia, low motivation, anxiousness, fatigue, trouble relaxing, irritability, self-esteem concerns, phase of life concerns  Mental Status Exam:  Appearance:   Neat     Behavior:  Appropriate and Sharing  Motor:  Normal  Speech/Language:   Clear and Coherent and Normal Rate  Affect:  Appropriate and Congruent  Mood:  normal  Thought process:  normal  Thought content:    WNL  Sensory/Perceptual disturbances:    WNL  Orientation:  oriented to person, place, time/date, and situation  Attention:  Good  Concentration:  Good  Memory:  WNL  Fund of knowledge:   Good  Insight:    Good  Judgment:   Good  Impulse Control:  Good   Risk Assessment: Danger to Self:  No Self-injurious Behavior: No Danger to Others: No Duty to Warn:no Physical Aggression / Violence:No  Access to Firearms a concern: No  Gang Involvement:No   Subjective: Patient presented to session to address concerns of anxiety and depression.  She reported minimal progress at this time.  She reported depression to be particularly challenging at present.  She identified her health concerns as stressful and preoccupying.  Counselor and patient continue to discuss patient engaging with physical therapy, and considering naturopathic support to wellness plan.  Counselor encouraged patient receiving body work therapy to address somatic pain and discomfort.  Counselor and patient discussed patient experience of low socialization opportunities.  Counselor assisted patient with ideas for improvement including advertising copywriter, out reach to [employer] to potentially offer an art class, preservation [place], and other considerations for volunteer or other work.   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  Counselor assisted patient in identifying what brings her joy in sense of purpose and value.  Patient identified cooking and baking, and counselor encouraged patient efforts in these endeavors.  Counselor and patient discussed patient stuck points and sense of obstacles, and ways to leverage hopefulness and opportunities for change.  Interventions: Solution-Oriented/Positive Psychology, Humanistic/Existential, Insight-Oriented, and Resourcing  Diagnosis:   ICD-10-CM   1. Adjustment disorder with mixed anxiety and depressed mood  F43.23       Plan: Patient is scheduled for follow-up; continue process work and developing coping skills.  Patient short-term goal between sessions to follow-up with assorted leads for social engagement and potential work opportunities, continue with small steps toward personal goal achievement.  [name], [facility]

## 2024-04-22 ENCOUNTER — Ambulatory Visit

## 2024-04-22 DIAGNOSIS — R279 Unspecified lack of coordination: Secondary | ICD-10-CM

## 2024-04-22 DIAGNOSIS — M62838 Other muscle spasm: Secondary | ICD-10-CM

## 2024-04-22 DIAGNOSIS — M6281 Muscle weakness (generalized): Secondary | ICD-10-CM

## 2024-04-22 DIAGNOSIS — R1011 Right upper quadrant pain: Secondary | ICD-10-CM
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
## 2024-04-23 ENCOUNTER — Ambulatory Visit: Payer: Self-pay

## 2024-04-24 ENCOUNTER — Other Ambulatory Visit: Payer: Self-pay

## 2024-04-29 ENCOUNTER — Ambulatory Visit: Admitting: Professional Counselor

## 2024-04-29 ENCOUNTER — Encounter: Payer: Self-pay | Admitting: Professional Counselor

## 2024-04-29 DIAGNOSIS — F4323 Adjustment disorder with mixed anxiety and depressed mood: Secondary | ICD-10-CM | POA: Diagnosis not present
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
## 2024-05-06 ENCOUNTER — Ambulatory Visit: Payer: Self-pay | Attending: Gastroenterology | Admitting: Physical Therapy

## 2024-05-06 DIAGNOSIS — R279 Unspecified lack of coordination: Secondary | ICD-10-CM | POA: Diagnosis present

## 2024-05-06 DIAGNOSIS — M6281 Muscle weakness (generalized): Secondary | ICD-10-CM | POA: Diagnosis present

## 2024-05-06 DIAGNOSIS — M62838 Other muscle spasm: Secondary | ICD-10-CM | POA: Diagnosis present
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
## 2024-05-13 ENCOUNTER — Ambulatory Visit: Admitting: Professional Counselor

## 2024-05-15 ENCOUNTER — Ambulatory Visit: Payer: Self-pay

## 2024-05-21 ENCOUNTER — Other Ambulatory Visit: Payer: Self-pay

## 2024-05-23 ENCOUNTER — Ambulatory Visit: Admitting: Physical Therapy

## 2024-05-23 DIAGNOSIS — R279 Unspecified lack of coordination: Secondary | ICD-10-CM

## 2024-05-23 DIAGNOSIS — M62838 Other muscle spasm: Secondary | ICD-10-CM

## 2024-05-23 DIAGNOSIS — M6281 Muscle weakness (generalized): Secondary | ICD-10-CM | POA: Diagnosis not present
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
## 2024-05-26 ENCOUNTER — Ambulatory Visit: Admitting: Professional Counselor

## 2024-05-26 ENCOUNTER — Encounter: Payer: Self-pay | Admitting: Professional Counselor

## 2024-05-26 DIAGNOSIS — F4323 Adjustment disorder with mixed anxiety and depressed mood: Secondary | ICD-10-CM | POA: Diagnosis not present

## 2024-05-26 NOTE — Progress Notes (Signed)
"   °      [facility] Counselor/Therapist Progress Note  Patient ID: Tracy [name], MRN: [id number],    Date: 05/26/2024  Time Spent: 11:06 AM to 12:11 PM  Treatment Type: Individual Therapy  Reported Symptoms: low mood, anhedonia, anxiousness, worries, stress, automatic negative thoughts, fatigue, self esteem concerns, low motivation, socially isolating tendencies, sense of loneliness, phase of life concerns, health concerns   Mental Status Exam:  Appearance:   Neat     Behavior:  Appropriate and Sharing  Motor:  Normal  Speech/Language:   Clear and Coherent and Normal Rate  Affect:  Appropriate and Congruent  Mood:  depressed  Thought process:  normal  Thought content:    WNL  Sensory/Perceptual disturbances:    WNL  Orientation:  oriented to person, place, time/date, and situation  Attention:  Good  Concentration:  Good  Memory:  WNL  Fund of knowledge:   Good  Insight:    Good  Judgment:   Good  Impulse Control:  Good   Risk Assessment: Danger to Self:  No Self-injurious Behavior: No Danger to Others: No Duty to Warn:no Physical Aggression / Violence:No  Access to Firearms a concern: No  Gang Involvement:No   Subjective: Patient presented to session to address concerns of anxiety and depression.  She reported minimal progress at this time.  She reported experience of significant health concerns including pain in her hand from arthritis, intention to get SIBO test, and other concerns.  Patient expressed experience of isolation and loneliness to be challenging these holidays, with increase in low mood, anhedonia and diminished motivation.  Counselor actively listened and affirmed patient feelings and experience.  Counselor and patient discussed family of origin messaging around theme of worthiness that continues to impact patient wellbeing and exacerbate sense of defeat.  Counselor utilized MI to assist patient in identifying obstacles toward goal progress including as  relates color certification classes and other tasks such as inquiring at [employer].  Counselor assisted patient in identifying short-term goals.  Interventions: Motivational Interviewing, Solution-Oriented/Positive Psychology, Humanistic/Existential, and Insight-Oriented  Diagnosis:   ICD-10-CM   1. Adjustment disorder with mixed anxiety and depressed mood  F43.23       Plan: Patient is scheduled for follow-up; continue process work and developing coping skills.  Patient short-term goal between sessions to finish color certification classes, and look into opportunities for social outlets such as church.  Continue to prioritize self-care and address health concerns.  [name], [facility]                   "

## 2024-06-03 ENCOUNTER — Ambulatory Visit: Admitting: Physical Therapy

## 2024-06-04 ENCOUNTER — Ambulatory Visit

## 2024-06-09 ENCOUNTER — Ambulatory Visit: Admitting: Professional Counselor

## 2024-06-09 ENCOUNTER — Encounter: Payer: Self-pay | Admitting: Professional Counselor

## 2024-06-09 DIAGNOSIS — F4323 Adjustment disorder with mixed anxiety and depressed mood: Secondary | ICD-10-CM | POA: Diagnosis not present

## 2024-06-09 NOTE — Progress Notes (Signed)
"   °      [facility] Counselor/Therapist Progress Note  Patient ID: CANNON QUINTON, MRN: [id number],    Date: 06/09/2024  Time Spent: 11:06 AM to 12:10 PM  Treatment Type: Individual Therapy  Reported Symptoms: Low mood, anhedonia, health concerns, worries, stress, loneliness, phase of life concerns, career concerns, anxiousness, fatigue, self-esteem concerns  Mental Status Exam:  Appearance:   Neat     Behavior:  Appropriate and Sharing  Motor:  Normal  Speech/Language:   Clear and Coherent and Normal Rate  Affect:  Appropriate and Congruent  Mood:  depressed  Thought process:  normal  Thought content:    WNL  Sensory/Perceptual disturbances:    WNL  Orientation:  oriented to person, place, time/date, and situation  Attention:  Good  Concentration:  Good  Memory:  WNL  Fund of knowledge:   Good  Insight:    Good  Judgment:   Good  Impulse Control:  Good   Risk Assessment: Danger to Self:  No Self-injurious Behavior: No Danger to Others: No Duty to Warn:no Physical Aggression / Violence:No  Access to Firearms a concern: No  Gang Involvement:No   Subjective: Patient presented to session to address concerns of anxiety and depression.  She reported minimal progress at this time.  She reported having been sick since the holidays, and to be receiving medical care, however frustrated with progression of illness and still feeling badly.  She processed experience of nears resolutions, identifying them to be improvement of her diet, and increase in social opportunities, including tai chi at the senior center.  Counselor reinforced patient personal goals, and help patient to identify what her goals are for internal growth and healing.  Patient identified wanting to beat herself up less, relieve and herself, and find her voice.  Counselor utilized the miracle question per solution focused behavioral therapy and counselor and patient discussed her responses, including patient experiences and  visibility across lifespan.  Counselor and patient discussed patient persona that exudes confidence, and how to nurture this part of herself into a more meaningful validation of her essential self.  They also discussed patient's appreciation for beauty as a value and coping resource.  Patient also identified therapeutic journaling and returning to the book the artist's way as potential therapeutic endeavors in the new year.  She continued to discern regarding disability.  Patient voiced world events as contributing to sense of despair, and counselor and patient discussed ideas for patient to contribute to social needs in a way that would also increase socialization, such as helping to feed those experiencing homelessness through a church.  Interventions: Cognitive Behavioral Therapy, Solution-Oriented/Positive Psychology, Humanistic/Existential, and Insight-Oriented  Diagnosis:   ICD-10-CM   1. Adjustment disorder with mixed anxiety and depressed mood  F43.23       Plan: Patient is scheduled for follow-up; continue process work and developing coping skills.  Patient short-term goal between sessions to consider therapeutic journaling around interpersonal themes discussed in session, continue to prioritize health, nurture self through diet, and look into social opportunities including volunteer work.  [name], [facility]                   "

## 2024-06-16 ENCOUNTER — Ambulatory Visit

## 2024-06-23 ENCOUNTER — Encounter: Payer: Self-pay | Admitting: Professional Counselor

## 2024-06-23 ENCOUNTER — Ambulatory Visit: Admitting: Professional Counselor

## 2024-06-23 DIAGNOSIS — F4322 Adjustment disorder with anxiety: Secondary | ICD-10-CM

## 2024-06-23 DIAGNOSIS — F331 Major depressive disorder, recurrent, moderate: Secondary | ICD-10-CM | POA: Diagnosis not present

## 2024-06-23 NOTE — Progress Notes (Signed)
"   °      [facility] Counselor/Therapist Progress Note  Patient ID: Tracy [name], MRN: [id number],    Date: 06/23/2024  Time Spent: 11:13 AM to 12:13 PM  Treatment Type: Individual Therapy  Reported Symptoms: Sadness, worries, loneliness, tearfulness, social isolation, health concerns, interpersonal concerns, phase of life concerns, career concerns, sense of existential dread, low motivation, financial strain, fatigue, anxiousness, trouble relaxing, irritability  Mental Status Exam:  Appearance:   Neat     Behavior:  Appropriate and Sharing  Motor:  Normal  Speech/Language:   Clear and Coherent and Normal Rate  Affect:  Depressed and Tearful  Mood:  depressed and sad  Thought process:  normal  Thought content:    WNL  Sensory/Perceptual disturbances:    WNL  Orientation:  oriented to person, place, time/date, and situation  Attention:  Good  Concentration:  Good  Memory:  WNL  Fund of knowledge:   Good  Insight:    Good  Judgment:   Good  Impulse Control:  Good   Risk Assessment: Danger to Self:  No Self-injurious Behavior: No Danger to Others: No Duty to Warn:no Physical Aggression / Violence:No  Access to Firearms a concern: No  Gang Involvement:No   Subjective: Patient presented to session to address concerns of anxiety and depression.  She reported minimal progress at this time.  Patient processed general sense of abandonment in interpersonal relationships, having been sick and as relates trajectory of friendships and family relationships.  Patient processed experience of existential dread as well, and sense of not knowing who she is and not having a mirror.  Counselor actively listened, affirmed patient feelings and experience, and helped to resource patient resiliency and healthy coping skills with external and internal.  Counselor encouraged patient looking into [place] and church as community outlets in social opportunities; patient identified intention to  look into tai chi and yoga.  Patient identified desire to work with flowers and collage as creative coping outlet.  Counselor and patient discussed patient need for increased inner sense of freedom, fulfillment, validation, and and safety.  Patient identified short-term goal of continuing to look into disability and spend more time outdoors in the sunshine.  Counselor reinforced patient resourcing.  Interventions: Motivational Interviewing, Solution-Oriented/Positive Psychology, Humanistic/Existential, Insight-Oriented, and Resourcing, Assertiveness/Communication  Diagnosis:   ICD-10-CM   1. Major depressive disorder, recurrent episode, moderate (HCC)  F33.1     2. Adjustment disorder with anxiety  F43.22       Plan: Patient is scheduled for follow-up; continue process work and developing coping skills.  Patient short-term goals between sessions to increase communication with loved ones, consider assorted outlets for healthy coping and social opportunities as discussed above, continue to look into disability, and spend time in the sunshine.  [name], [facility]                   "

## 2024-06-24 ENCOUNTER — Ambulatory Visit

## 2024-07-02 ENCOUNTER — Ambulatory Visit

## 2024-07-09 ENCOUNTER — Ambulatory Visit: Admitting: Professional Counselor

## 2024-07-09 ENCOUNTER — Encounter: Payer: Self-pay | Admitting: Professional Counselor

## 2024-07-09 DIAGNOSIS — F331 Major depressive disorder, recurrent, moderate: Secondary | ICD-10-CM

## 2024-07-09 DIAGNOSIS — F411 Generalized anxiety disorder: Secondary | ICD-10-CM

## 2024-07-09 NOTE — Progress Notes (Unsigned)
"   °      [facility] Counselor/Therapist Progress Note  Patient ID: [name], MRN: [id number],    Date: 07/09/2024  Time Spent: 10:18 AM to 11:15 AM  Treatment Type: Individual Therapy  Reported Symptoms: Anxiousness, nervousness, socially isolating tendencies, somatic/health concerns, low mood, anhedonia, sadness, worries, existential concerns, restlessness, frustration, trouble relaxing, catastrophic thinking, fatigue, self-esteem concerns, phase of life concerns  Mental Status Exam:  Appearance:   Neat     Behavior:  Appropriate and Sharing  Motor:  Normal  Speech/Language:   Clear and Coherent and Normal Rate  Affect:  Depressed  Mood:  anxious, depressed, and sad  Thought process:  normal  Thought content:    WNL  Sensory/Perceptual disturbances:    WNL  Orientation:  oriented to person, place, time/date, and situation  Attention:  Good  Concentration:  Good  Memory:  WNL  Fund of knowledge:   Good  Insight:    Good  Judgment:   Good  Impulse Control:  Good   Risk Assessment: Danger to Self:  No Self-injurious Behavior: No Danger to Others: No Duty to Warn:no Physical Aggression / Violence:No  Access to Firearms a concern: No  Gang Involvement:No   Subjective: Patient presented to session to address concerns of anxiety and depression.  She reported minimal progress at this time.  Patient identified having cut her finger and hurt her knee, and to continue to have chronic health concerns frustrating and fatiguing her.  She also identified worrying regarding global events and existential concerns.  Counselor encouraged cognitive restructuring around what is within and outside of ones control, and focus on realistic ways that supporting self and others.  Patient reported receiving confirmation of being unable to apply for disability due to her age, and voiced continued worried regarding finances.  She reported progress in having attended a yoga class online, however voiced  that that is how she hurt her knee.  Counselor suggested restorative yoga as an alternative.  Patient processed the experience of feeling invisible and/or disconnected from caregivers and others by history, and how this continues to impact her quality of life, relationships and wellbeing.  Counselor actively listened, affirmed patient mentioned experience, and helped to facilitate insights into patient concerns, and reinforced patient positive coping skills including social outlets.  Interventions: Solution-Oriented/Positive Psychology, Humanistic/Existential, and Insight-Oriented  Diagnosis:   ICD-10-CM   1. Generalized anxiety disorder  F41.1     2. Major depressive disorder, recurrent episode, moderate (HCC)  F33.1       Plan: Patient is scheduled for follow-up; continue process work and developing coping skills.  Patient short-term goal between sessions to continue to resource healthy coping skills including creating structure and resourcing social opportunities in her life, work on cognitive restructuring around automatic negative thoughts and catastrophic thinking.  Tracy [name],  [facility]                   "

## 2024-08-26 ENCOUNTER — Ambulatory Visit: Admitting: Professional Counselor

## 2024-09-09 ENCOUNTER — Ambulatory Visit: Admitting: Professional Counselor

## 2024-09-24 ENCOUNTER — Ambulatory Visit: Admitting: Professional Counselor

## 2024-10-07 ENCOUNTER — Ambulatory Visit: Admitting: Professional Counselor

## 2024-10-20 ENCOUNTER — Ambulatory Visit: Admitting: Professional Counselor
